# Patient Record
Sex: Male | Born: 1980 | Race: Black or African American | Hispanic: No | Marital: Married | Smoking: Current every day smoker
Health system: Southern US, Community
[De-identification: ages and names within clinical notes are randomized; demographics above are authoritative.]

## PROBLEM LIST (undated history)

## (undated) DIAGNOSIS — J45909 Unspecified asthma, uncomplicated: Secondary | ICD-10-CM

## (undated) HISTORY — PX: ABDOMINAL SURGERY: SHX537

---
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
## 2001-07-12 ENCOUNTER — Emergency Department (HOSPITAL_COMMUNITY): Admission: EM | Admit: 2001-07-12 | Discharge: 2001-07-12 | Payer: Self-pay | Admitting: Emergency Medicine

## 2001-12-28 ENCOUNTER — Encounter: Payer: Self-pay | Admitting: Emergency Medicine

## 2001-12-28 ENCOUNTER — Emergency Department (HOSPITAL_COMMUNITY): Admission: EM | Admit: 2001-12-28 | Discharge: 2001-12-29 | Payer: Self-pay | Admitting: Emergency Medicine

## 2003-07-07 ENCOUNTER — Emergency Department (HOSPITAL_COMMUNITY): Admission: EM | Admit: 2003-07-07 | Discharge: 2003-07-07 | Payer: Self-pay | Admitting: Family Medicine

## 2003-07-10 ENCOUNTER — Emergency Department (HOSPITAL_COMMUNITY): Admission: EM | Admit: 2003-07-10 | Discharge: 2003-07-10 | Payer: Self-pay | Admitting: Family Medicine

## 2003-12-14 ENCOUNTER — Emergency Department (HOSPITAL_COMMUNITY): Admission: EM | Admit: 2003-12-14 | Discharge: 2003-12-14 | Payer: Self-pay | Admitting: Emergency Medicine

## 2005-07-05 ENCOUNTER — Emergency Department (HOSPITAL_COMMUNITY): Admission: EM | Admit: 2005-07-05 | Discharge: 2005-07-05 | Payer: Self-pay | Admitting: Emergency Medicine

## 2007-04-17 ENCOUNTER — Inpatient Hospital Stay (HOSPITAL_COMMUNITY): Admission: EM | Admit: 2007-04-17 | Discharge: 2007-04-20 | Payer: Self-pay | Admitting: Emergency Medicine

## 2007-10-15 ENCOUNTER — Emergency Department (HOSPITAL_COMMUNITY): Admission: EM | Admit: 2007-10-15 | Discharge: 2007-10-15 | Payer: Self-pay | Admitting: Emergency Medicine

## 2008-02-06 ENCOUNTER — Encounter: Admission: RE | Admit: 2008-02-06 | Discharge: 2008-02-06 | Payer: Self-pay | Admitting: Family Medicine
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
## 2010-11-24 LAB — URINALYSIS, MICROSCOPIC ONLY
Glucose, UA: NEGATIVE
Ketones, ur: 40 — AB
Leukocytes, UA: NEGATIVE
Nitrite: NEGATIVE
Protein, ur: 100 — AB
Specific Gravity, Urine: 1.029
Urobilinogen, UA: 1
pH: 6

## 2010-11-24 LAB — I-STAT 8, (EC8 V) (CONVERTED LAB)
Acid-base deficit: 5 — ABNORMAL HIGH
Bicarbonate: 20.1
Chloride: 102
HCT: 49
Operator id: 222501
TCO2: 21
pCO2, Ven: 35.6 — ABNORMAL LOW

## 2010-11-24 LAB — CBC
HCT: 38.7 — ABNORMAL LOW
HCT: 44.3
Hemoglobin: 13.3
Hemoglobin: 15.4
MCHC: 34.5
MCHC: 34.8
MCHC: 34.4
MCV: 93.5
MCV: 93.6
Platelets: 283
Platelets: 208
RBC: 4.74
RDW: 13.1
RDW: 13.1
RDW: 12.7
WBC: 12.9 — ABNORMAL HIGH

## 2010-11-24 LAB — TYPE AND SCREEN: Antibody Screen: NEGATIVE

## 2010-11-24 LAB — POCT I-STAT CREATININE: Operator id: 222501

## 2010-11-24 LAB — ABO/RH: ABO/RH(D): A POS

## 2010-11-24 LAB — BASIC METABOLIC PANEL
BUN: 2 — ABNORMAL LOW
Calcium: 8.6
Creatinine, Ser: 0.92
GFR calc non Af Amer: >60
Glucose, Bld: 106 — ABNORMAL HIGH

## 2010-11-24 LAB — PROTIME-INR
INR: 1.1
Prothrombin Time: 14.5

## 2010-12-01 LAB — GC/CHLAMYDIA PROBE AMP, GENITAL: Chlamydia, DNA Probe: NEGATIVE

## 2014-08-31 ENCOUNTER — Emergency Department (HOSPITAL_COMMUNITY): Payer: Self-pay

## 2014-08-31 ENCOUNTER — Emergency Department (HOSPITAL_COMMUNITY)
Admission: EM | Admit: 2014-08-31 | Discharge: 2014-08-31 | Disposition: A | Discharge status: Home / Self Care | Payer: Self-pay | Attending: Emergency Medicine | Admitting: Emergency Medicine

## 2014-08-31 DIAGNOSIS — Z880 Allergy status to penicillin: Secondary | ICD-10-CM | POA: Insufficient documentation

## 2014-08-31 DIAGNOSIS — K529 Noninfective gastroenteritis and colitis, unspecified: Secondary | ICD-10-CM | POA: Insufficient documentation

## 2014-08-31 DIAGNOSIS — E860 Dehydration: Secondary | ICD-10-CM | POA: Insufficient documentation

## 2014-08-31 LAB — CBC WITH DIFFERENTIAL/PLATELET
BASOS ABS: 0 10*3/uL (ref 0.0–0.1)
BASOS PCT: 0 % (ref 0–1)
EOS ABS: 0.4 10*3/uL (ref 0.0–0.7)
EOS PCT: 5 % (ref 0–5)
HCT: 42.2 % (ref 39.0–52.0)
Hemoglobin: 15.3 g/dL (ref 13.0–17.0)
LYMPHS ABS: 2 10*3/uL (ref 0.7–4.0)
Lymphocytes Relative: 23 % (ref 12–46)
MCH: 32.6 pg (ref 26.0–34.0)
MCHC: 36.3 g/dL — AB (ref 30.0–36.0)
MCV: 89.8 fL (ref 78.0–100.0)
MONOS PCT: 8 % (ref 3–12)
Monocytes Absolute: 0.7 10*3/uL (ref 0.1–1.0)
NEUTROS PCT: 64 % (ref 43–77)
Neutro Abs: 5.5 10*3/uL (ref 1.7–7.7)
PLATELETS: 284 10*3/uL (ref 150–400)
RBC: 4.7 MIL/uL (ref 4.22–5.81)
RDW: 12.3 % (ref 11.5–15.5)
WBC: 8.7 10*3/uL (ref 4.0–10.5)

## 2014-08-31 LAB — I-STAT CHEM 8, ED
BUN: 9 mg/dL (ref 6–20)
CREATININE: 1.1 mg/dL (ref 0.61–1.24)
Calcium, Ion: 1.19 mmol/L (ref 1.12–1.23)
Chloride: 100 mmol/L — ABNORMAL LOW (ref 101–111)
GLUCOSE: 80 mg/dL (ref 65–99)
HCT: 48 % (ref 39.0–52.0)
HEMOGLOBIN: 16.3 g/dL (ref 13.0–17.0)
Potassium: 3.9 mmol/L (ref 3.5–5.1)
SODIUM: 137 mmol/L (ref 135–145)
TCO2: 25 mmol/L (ref 0–100)

## 2014-08-31 LAB — URINALYSIS, ROUTINE W REFLEX MICROSCOPIC
GLUCOSE, UA: NEGATIVE mg/dL
HGB URINE DIPSTICK: NEGATIVE
KETONES UR: NEGATIVE mg/dL
Leukocytes, UA: NEGATIVE
Nitrite: NEGATIVE
PH: 6 (ref 5.0–8.0)
PROTEIN: NEGATIVE mg/dL
Specific Gravity, Urine: 1.031 — ABNORMAL HIGH (ref 1.005–1.030)
Urobilinogen, UA: 1 mg/dL (ref 0.0–1.0)

## 2014-08-31 MED ORDER — ALBUTEROL SULFATE HFA 108 (90 BASE) MCG/ACT IN AERS
2.0000 | INHALATION_SPRAY | RESPIRATORY_TRACT | Status: DC | PRN
  Administered 2014-08-31: 2 via RESPIRATORY_TRACT
  Filled 2014-08-31: qty 6.7

## 2014-08-31 MED ORDER — IBUPROFEN 800 MG PO TABS: 800.0000 mg | ORAL_TABLET | Freq: Three times a day (TID) | ORAL | Status: DC | PRN

## 2014-08-31 MED ORDER — KETOROLAC TROMETHAMINE 30 MG/ML IJ SOLN
30.0000 mg | Freq: Once | INTRAMUSCULAR | Status: AC
  Administered 2014-08-31: 30 mg via INTRAVENOUS
  Filled 2014-08-31: qty 1

## 2014-08-31 MED ORDER — SODIUM CHLORIDE 0.9 % IV BOLUS (SEPSIS)
1000.0000 mL | Freq: Once | INTRAVENOUS | Status: AC
  Administered 2014-08-31: 1000 mL via INTRAVENOUS

## 2014-08-31 MED ORDER — AEROCHAMBER PLUS FLO-VU MEDIUM MISC
1.0000 | Freq: Once | Status: AC
  Administered 2014-08-31: 1
  Filled 2014-08-31: qty 1

## 2014-08-31 NOTE — ED Notes (Addendum)
Pt st's he has had headache x's 1 week.  St's he has had body aches and elevated temp,  Pt st's he feels better but continues to have headache and back pain

## 2014-08-31 NOTE — ED Provider Notes (Signed)
CSN: [id number]     Arrival date & time 08/31/14  1452 History   First MD Initiated Contact with Patient 08/31/14 1721     Chief Complaint  Patient presents with  . Headache     (Consider location/radiation/quality/duration/timing/severity/associated sxs/prior Treatment) HPI Patient presents to the emergency department with body aches, nausea, headache, vomiting, and chills over the last few days.  The patient states that on Friday.  His symptoms subsided, but then had another episode of vomiting yesterday.  The patient states that he had diarrhea associated with this as well.  Patient states that he did not have any chest pain, shortness of breath, abdominal pain, back pain, dysuria, incontinence, cough, lightheadedness, near syncope or syncope.  The patient states that did take some over-the-counter medications, which seemed to help with his headache somewhat No past medical history on file. No past surgical history on file. No family history on file. History  Substance Use Topics  . Smoking status: Not on file  . Smokeless tobacco: Not on file  . Alcohol Use: Not on file    Review of Systems  All other systems negative except as documented in the HPI. All pertinent positives and negatives as reviewed in the HPI.  Allergies  Ampicillin and Penicillins  Home Medications   Prior to Admission medications   Not on File   BP 118/74 mmHg  Pulse 44  Temp(Src) 98.5 F (36.9 C) (Oral)  Resp 20  SpO2 100% Physical Exam  Constitutional: He is oriented to person, place, and time. He appears well-developed and well-nourished. No distress.  HENT:  Head: Normocephalic and atraumatic.  Mouth/Throat: Oropharynx is clear and moist.  Eyes: Pupils are equal, round, and reactive to light.  Neck: Normal range of motion. Neck supple.  Cardiovascular: Normal rate, regular rhythm and normal heart sounds.  Exam reveals no gallop and no friction rub.   No murmur heard. Pulmonary/Chest: Effort  normal and breath sounds normal. No respiratory distress.  Abdominal: Soft. Bowel sounds are normal. He exhibits no distension. There is no tenderness.  Neurological: He is alert and oriented to person, place, and time. He exhibits normal muscle tone. Coordination normal.  Skin: Skin is warm and dry. No rash noted. No erythema.  Nursing note and vitals reviewed.   ED Course  Procedures (including critical care time) Labs Review Labs Reviewed  CBC WITH DIFFERENTIAL/PLATELET - Abnormal; Notable for the following:    MCHC 36.3 (*)    All other components within normal limits  URINALYSIS, ROUTINE W REFLEX MICROSCOPIC (NOT AT [facility]) - Abnormal; Notable for the following:    Specific Gravity, Urine 1.031 (*)    Bilirubin Urine SMALL (*)    All other components within normal limits  I-STAT CHEM 8, ED - Abnormal; Notable for the following:    Chloride 100 (*)    All other components within normal limits    Imaging Review Dg Chest 2 View  08/31/2014   CLINICAL DATA:  34 year old male with fever and headache.  EXAM: CHEST  2 VIEW  COMPARISON:  None.  FINDINGS: The heart size and mediastinal contours are within normal limits. Both lungs are clear. The visualized skeletal structures are unremarkable.  IMPRESSION: No active cardiopulmonary disease.   Electronically Signed   By: Elgie CollardArash  Radparvar M.D.   On: 08/31/2014 20:31      The patient's headache is most likely associated with the gastroenteritis and dehydration that the patient has had patient is advised return here as needed.  He does not have any neurological deficits noted on exam.  Intracranial abnormalities such as bleeding seem less likely.  His headache has responded to over-the-counter ibuprofen  Charlestine Night, PA-C 08/31/14 2100  Gilda Crease, MD 08/31/14 2104

## 2014-08-31 NOTE — Discharge Instructions (Signed)
Follow-up with the clinic provided.  Return here as needed.  Increase your fluid intake and rest as much as possible

## 2015-02-13 ENCOUNTER — Emergency Department (HOSPITAL_COMMUNITY)
Admission: EM | Admit: 2015-02-13 | Discharge: 2015-02-13 | Disposition: A | Discharge status: Home / Self Care | Payer: Self-pay | Attending: Emergency Medicine | Admitting: Emergency Medicine

## 2015-02-13 ENCOUNTER — Encounter (HOSPITAL_COMMUNITY): Payer: Self-pay | Admitting: Emergency Medicine

## 2015-02-13 DIAGNOSIS — F17210 Nicotine dependence, cigarettes, uncomplicated: Secondary | ICD-10-CM | POA: Insufficient documentation

## 2015-02-13 DIAGNOSIS — M25512 Pain in left shoulder: Secondary | ICD-10-CM | POA: Insufficient documentation

## 2015-02-13 DIAGNOSIS — Z880 Allergy status to penicillin: Secondary | ICD-10-CM | POA: Insufficient documentation

## 2015-02-13 DIAGNOSIS — M545 Low back pain, unspecified: Secondary | ICD-10-CM

## 2015-02-13 DIAGNOSIS — J45909 Unspecified asthma, uncomplicated: Secondary | ICD-10-CM | POA: Insufficient documentation

## 2015-02-13 HISTORY — DX: Unspecified asthma, uncomplicated: J45.909

## 2015-02-13 MED ORDER — METHOCARBAMOL 500 MG PO TABS: 500.0000 mg | ORAL_TABLET | Freq: Two times a day (BID) | ORAL | Status: DC | PRN

## 2015-02-13 MED ORDER — NAPROXEN 250 MG PO TABS: 250.0000 mg | ORAL_TABLET | Freq: Two times a day (BID) | ORAL | Status: DC

## 2015-02-13 NOTE — ED Notes (Signed)
Pt from home for eval of bilateral lower back and also left shoulder pain that started Friday, pt states he does a lot of heavy lifting at work and states has taken ibuprofen today for pain with minimal relief. Denies any numbness.

## 2015-02-13 NOTE — ED Provider Notes (Signed)
CSN: [id number]     Arrival date & time 02/13/15  1830 History  By signing my name below, I, Evon Slack, attest that this documentation has been prepared under the direction and in the presence of Everlene Farrier, PA-C. Electronically Signed: Evon Slack, ED Scribe. 02/13/2015. 6:54 PM.      Chief Complaint  Patient presents with  . Back Pain  . Shoulder Pain   The history is provided by the patient. No language interpreter was used.   HPI Comments: Ronald Walker is a 34 y.o. male who presents to the Emergency Department complaining of low back pain onset 7 days prior. Pt rates the pain 7/10. He states that the pain is worse with movement and standing up. Pt reports that he does a lot of heavy lifting while at work. Pt reports taking ibuprofen with some relief. Pt also complaining of left shoulder pain intermittently, but none currently. Denies fevers, falls, trauma, numbness, weakness, dysuria, heamturia, ugency, frequency, difficulty urinating, bowel/bladder incontince, abdominal pain, nausea or vomitng.  Denies personal Hx of cancer or Iv drug use.  Past Medical History  Diagnosis Date  . Asthma    History reviewed. No pertinent past surgical history. No family history on file. Social History  Substance Use Topics  . Smoking status: Current Every Day Smoker -- 1.00 packs/day    Types: Cigarettes  . Smokeless tobacco: None  . Alcohol Use: Yes    Review of Systems  Constitutional: Negative for fever.  Eyes: Negative for visual disturbance.  Gastrointestinal: Negative for nausea, vomiting, abdominal pain and diarrhea.  Genitourinary: Negative for dysuria, urgency, frequency, hematuria, decreased urine volume and difficulty urinating.  Musculoskeletal: Positive for back pain and arthralgias. Negative for neck pain.  Skin: Negative for rash and wound.  Neurological: Negative for dizziness, weakness, light-headedness and numbness.     Allergies  Ampicillin and  Penicillins  Home Medications   Prior to Admission medications   Medication Sig Start Date End Date Taking? Authorizing Provider  methocarbamol (ROBAXIN) 500 MG tablet Take 1 tablet (500 mg total) by mouth 2 (two) times daily as needed for muscle spasms. 02/13/15   Everlene Farrier, PA-C  naproxen (NAPROSYN) 250 MG tablet Take 1 tablet (250 mg total) by mouth 2 (two) times daily with a meal. 02/13/15   Everlene Farrier, PA-C   BP 131/60 mmHg  Pulse 74  Temp(Src) 98.1 F (36.7 C) (Oral)  Resp 18  Ht  (1.753 m)  Wt 81.647 kg  BMI 26.57 kg/m2  SpO2 100%   Physical Exam  Constitutional: He appears well-developed and well-nourished. No distress.  Nontoxic appearing.  HENT:  Head: Normocephalic and atraumatic.  Eyes: Conjunctivae are normal. Pupils are equal, round, and reactive to light. Right eye exhibits no discharge. Left eye exhibits no discharge.  Neck: Normal range of motion. Neck supple. No JVD present.  Cardiovascular: Normal rate, regular rhythm, normal heart sounds and intact distal pulses.   Pulmonary/Chest: Effort normal and breath sounds normal. No respiratory distress. He has no wheezes. He has no rales.  Abdominal: Soft. He exhibits no distension. There is no tenderness. There is no guarding.  Musculoskeletal: Normal range of motion. He exhibits tenderness. He exhibits no edema.  Bilateral paraspinal low back tenderness. No midline neck or back tenderness. Good strength in his bilateral upper and lower extremities. He is able to ambulate with normal gait. No back edema, erythema, deformity or ecchymosis. Normal shoulder range of motion bilaterally. No bony point tenderness.  Lymphadenopathy:  He has no cervical adenopathy.  Neurological: He is alert. He has normal reflexes. He displays normal reflexes. Coordination normal.  Sensation is intact his bilateral lower extremities. Bilateral patellar DTRs are intact. He is able to ambulate with normal gait.  Skin: Skin is  warm and dry. No rash noted. He is not diaphoretic. No erythema. No pallor.  Psychiatric: He has a normal mood and affect. His behavior is normal.  Nursing note and vitals reviewed.   ED Course  Procedures (including critical care time) DIAGNOSTIC STUDIES: Oxygen Saturation is 100% on RA, normal by my interpretation.    COORDINATION OF CARE: 6:44 PM-Discussed treatment plan with pt at bedside and pt agreed to plan.     Labs Review Labs Reviewed - No data to display  Imaging Review No results found.    EKG Interpretation None      Filed Vitals:   02/13/15 1839  BP: 131/60  Pulse: 74  Temp: 98.1 F (36.7 C)  TempSrc: Oral  Resp: 18  Height: 5\' 9"  (1.753 m)  Weight: 81.647 kg  SpO2: 100%     MDM   Meds given in ED:  Medications - No data to display  Discharge Medication List as of 02/13/2015  6:49 PM    START taking these medications   Details  methocarbamol (ROBAXIN) 500 MG tablet Take 1 tablet (500 mg total) by mouth 2 (two) times daily as needed for muscle spasms., Starting 02/13/2015, Until Discontinued, Print    naproxen (NAPROSYN) 250 MG tablet Take 1 tablet (250 mg total) by mouth 2 (two) times daily with a meal., Starting 02/13/2015, Until Discontinued, Print        Final diagnoses:  Bilateral low back pain without sciatica    Patient with bilateral paraspinous low back pain for one week which is worse with movement. Patient does lots of heavy lifting at work. No falls or trauma. No midline tenderness. No neurological deficits and normal neuro exam.  Patient can walk with normal gait.   No loss of bowel or bladder control.  No concern for cauda equina.  No fever, night sweats, weight loss, h/o cancer, IVDU.  RICE protocol and pain medicine indicated and discussed with patient. I discussed safe lifting practices. I advised the patient to follow-up with their primary care provider this week. I advised the patient to return to the emergency department with  new or worsening symptoms or new concerns. The patient verbalized understanding and agreement with plan.    I personally performed the services described in this documentation, which was scribed in my presence. The recorded information has been reviewed and is accurate.      Everlene FarrierWilliam Karmah Potocki, PA-C 02/13/15 1857  Gwyneth SproutWhitney Plunkett, MD 02/14/15 0005

## 2015-02-13 NOTE — Discharge Instructions (Signed)
Back Exercises The following exercises strengthen the muscles that help to support the back. They also help to keep the lower back flexible. Doing these exercises can help to prevent back pain or lessen existing pain. If you have back pain or discomfort, try doing these exercises 2-3 times each day or as told by your health care provider. When the pain goes away, do them once each day, but increase the number of times that you repeat the steps for each exercise (do more repetitions). If you do not have back pain or discomfort, do these exercises once each day or as told by your health care provider. EXERCISES Single Knee to Chest Repeat these steps 3-5 times for each leg:  Lie on your back on a firm bed or the floor with your legs extended.  Bring one knee to your chest. Your other leg should stay extended and in contact with the floor.  Hold your knee in place by grabbing your knee or thigh.  Pull on your knee until you feel a gentle stretch in your lower back.  Hold the stretch for 10-30 seconds.  Slowly release and straighten your leg. Pelvic Tilt Repeat these steps 5-10 times:  Lie on your back on a firm bed or the floor with your legs extended.  Bend your knees so they are pointing toward the ceiling and your feet are flat on the floor.  Tighten your lower abdominal muscles to press your lower back against the floor. This motion will tilt your pelvis so your tailbone points up toward the ceiling instead of pointing to your feet or the floor.  With gentle tension and even breathing, hold this position for 5-10 seconds. Cat-Cow Repeat these steps until your lower back becomes more flexible:  Get into a hands-and-knees position on a firm surface. Keep your hands under your shoulders, and keep your knees under your hips. You may place padding under your knees for comfort.  Let your head hang down, and point your tailbone toward the floor so your lower back becomes rounded like the  back of a cat.  Hold this position for 5 seconds.  Slowly lift your head and point your tailbone up toward the ceiling so your back forms a sagging arch like the back of a cow.  Hold this position for 5 seconds. Press-Ups Repeat these steps 5-10 times:  Lie on your abdomen (face-down) on the floor.  Place your palms near your head, about shoulder-width apart.  While you keep your back as relaxed as possible and keep your hips on the floor, slowly straighten your arms to raise the top half of your body and lift your shoulders. Do not use your back muscles to raise your upper torso. You may adjust the placement of your hands to make yourself more comfortable.  Hold this position for 5 seconds while you keep your back relaxed.  Slowly return to lying flat on the floor. Bridges Repeat these steps 10 times:  Lie on your back on a firm surface.  Bend your knees so they are pointing toward the ceiling and your feet are flat on the floor.  Tighten your buttocks muscles and lift your buttocks off of the floor until your waist is at almost the same height as your knees. You should feel the muscles working in your buttocks and the back of your thighs. If you do not feel these muscles, slide your feet 1-2 inches farther away from your buttocks.  Hold this position for 3-5  seconds.  Slowly lower your hips to the starting position, and allow your buttocks muscles to relax completely. If this exercise is too easy, try doing it with your arms crossed over your chest. Abdominal Crunches Repeat these steps 5-10 times:  Lie on your back on a firm bed or the floor with your legs extended.  Bend your knees so they are pointing toward the ceiling and your feet are flat on the floor.  Cross your arms over your chest.  Tip your chin slightly toward your chest without bending your neck.  Tighten your abdominal muscles and slowly raise your trunk (torso) high enough to lift your shoulder blades a  tiny bit off of the floor. Avoid raising your torso higher than that, because it can put too much stress on your low back and it does not help to strengthen your abdominal muscles.  Slowly return to your starting position. Back Lifts Repeat these steps 5-10 times: 1. Lie on your abdomen (face-down) with your arms at your sides, and rest your forehead on the floor. 2. Tighten the muscles in your legs and your buttocks. 3. Slowly lift your chest off of the floor while you keep your hips pressed to the floor. Keep the back of your head in line with the curve in your back. Your eyes should be looking at the floor. 4. Hold this position for 3-5 seconds. 5. Slowly return to your starting position. SEEK MEDICAL CARE IF:  Your back pain or discomfort gets much worse when you do an exercise.  Your back pain or discomfort does not lessen within 2 hours after you exercise. If you have any of these problems, stop doing these exercises right away. Do not do them again unless your health care provider says that you can. SEEK IMMEDIATE MEDICAL CARE IF:  You develop sudden, severe back pain. If this happens, stop doing the exercises right away. Do not do them again unless your health care provider says that you can.   This information is not intended to replace advice given to you by your health care provider. Make sure you discuss any questions you have with your health care provider.   Document Released: 03/29/2004 Document Revised: 11/10/2014 Document Reviewed: 04/15/2014 Elsevier Interactive Patient Education 2016 [employer]  Back Pain, Adult Back pain is very common in adults.The cause of back pain is rarely dangerous and the pain often gets better over time.The cause of your back pain may not be known. Some common causes of back pain include:  Strain of the muscles or ligaments supporting the spine.  Wear and tear (degeneration) of the spinal disks.  Arthritis.  Direct injury to the  back. For many people, back pain may return. Since back pain is rarely dangerous, most people can learn to manage this condition on their own. HOME CARE INSTRUCTIONS Watch your back pain for any changes. The following actions may help to lessen any discomfort you are feeling:  Remain active. It is stressful on your back to sit or stand in one place for long periods of time. Do not sit, drive, or stand in one place for more than 30 minutes at a time. Take short walks on even surfaces as soon as you are able.Try to increase the length of time you walk each day.  Exercise regularly as directed by your health care provider. Exercise helps your back heal faster. It also helps avoid future injury by keeping your muscles strong and flexible.  Do not stay in  bed.Resting more than 1-2 days can delay your recovery.  Pay attention to your body when you bend and lift. The most comfortable positions are those that put less stress on your recovering back. Always use proper lifting techniques, including:  Bending your knees.  Keeping the load close to your body.  Avoiding twisting.  Find a comfortable position to sleep. Use a firm mattress and lie on your side with your knees slightly bent. If you lie on your back, put a pillow under your knees.  Avoid feeling anxious or stressed.Stress increases muscle tension and can worsen back pain.It is important to recognize when you are anxious or stressed and learn ways to manage it, such as with exercise.  Take medicines only as directed by your health care provider. Over-the-counter medicines to reduce pain and inflammation are often the most helpful.Your health care provider may prescribe muscle relaxant drugs.These medicines help dull your pain so you can more quickly return to your normal activities and healthy exercise.  Apply ice to the injured area:  Put ice in a plastic bag.  Place a towel between your skin and the bag.  Leave the ice on for 20  minutes, 2-3 times a day for the first 2-3 days. After that, ice and heat may be alternated to reduce pain and spasms.  Maintain a healthy weight. Excess weight puts extra stress on your back and makes it difficult to maintain good posture. SEEK MEDICAL CARE IF:  You have pain that is not relieved with rest or medicine.  You have increasing pain going down into the legs or buttocks.  You have pain that does not improve in one week.  You have night pain.  You lose weight.  You have a fever or chills. SEEK IMMEDIATE MEDICAL CARE IF:   You develop new bowel or bladder control problems.  You have unusual weakness or numbness in your arms or legs.  You develop nausea or vomiting.  You develop abdominal pain.  You feel faint.   This information is not intended to replace advice given to you by your health care provider. Make sure you discuss any questions you have with your health care provider.   Document Released: 02/19/2005 Document Revised: 03/12/2014 Document Reviewed: 06/23/2013 Elsevier Interactive Patient Education 2016 [place] Injury Prevention Back injuries can be very painful. They can also be difficult to heal. After having one back injury, you are more likely to injure your back again. It is important to learn how to avoid injuring or re-injuring your back. The following tips can help you to prevent a back injury. WHAT SHOULD I KNOW ABOUT PHYSICAL FITNESS?  Exercise for 30 minutes per day on most days of the week or as directed by your health care provider. Make sure to:  Do aerobic exercises, such as walking, jogging, biking, or swimming.  Do exercises that increase balance and strength, such as tai chi and yoga. These can decrease your risk of falling and injuring your back.  Do stretching exercises to help with flexibility.  Try to develop strong abdominal muscles. Your abdominal muscles provide a lot of the support that is needed by your  back.  Maintain a healthy weight. This helps to decrease your risk of a back injury. WHAT SHOULD I KNOW ABOUT MY DIET?  Talk with your health care provider about your overall diet. Take supplements and vitamins only as directed by your health care provider.  Talk with your health care provider about how  much calcium and vitamin D you need each day. These nutrients help to prevent weakening of the bones (osteoporosis). Osteoporosis can cause broken (fractured) bones, which lead to back pain. °· Include good sources of calcium in your diet, such as dairy products, green leafy vegetables, and products that have had calcium added to them (fortified). °· Include good sources of vitamin D in your diet, such as milk and foods that are fortified with vitamin D. °WHAT SHOULD I KNOW ABOUT MY POSTURE? °· Sit up straight and stand up straight. Avoid leaning forward when you sit or hunching over when you stand. °· Choose chairs that have good low-back (lumbar) support. °· If you work at a desk, sit close to it so you do not need to lean over. Keep your chin tucked in. Keep your neck drawn back, and keep your elbows bent at a right angle. Your arms should look like the letter "L." °· Sit high and close to the steering wheel when you drive. Add a lumbar support to your car seat, if needed. °· Avoid sitting or standing in one position for very long. Take breaks to get up, stretch, and walk around at least one time every hour. Take breaks every hour if you are driving for long periods of time. °· Sleep on your side with your knees slightly bent, or sleep on your back with a pillow under your knees. Do not lie on the front of your body to sleep. °WHAT SHOULD I KNOW ABOUT LIFTING, TWISTING, AND REACHING? °Lifting and Heavy Lifting °· Avoid heavy lifting, especially repetitive heavy lifting. If you must do heavy lifting: °¨ Stretch before lifting. °¨ Work slowly. °¨ Rest between lifts. °¨ Use a tool such as a cart or a dolly to  move objects if one is available. °¨ Make several small trips instead of carrying one heavy load. °¨ Ask for help when you need it, especially when moving big objects. °· Follow these steps when lifting: °¨ Stand with your feet shoulder-width apart. °¨ Get as close to the object as you can. Do not try to pick up a heavy object that is far from your body. °¨ Use handles or lifting straps if they are available. °¨ Bend at your knees. Squat down, but keep your heels off the floor. °¨ Keep your shoulders pulled back, your chin tucked in, and your back straight. °¨ Lift the object slowly while you tighten the muscles in your legs, abdomen, and buttocks. Keep the object as close to the center of your body as possible. °· Follow these steps when putting down a heavy load: °¨ Stand with your feet shoulder-width apart. °¨ Lower the object slowly while you tighten the muscles in your legs, abdomen, and buttocks. Keep the object as close to the center of your body as possible. °¨ Keep your shoulders pulled back, your chin tucked in, and your back straight. °¨ Bend at your knees. Squat down, but keep your heels off the floor. °¨ Use handles or lifting straps if they are available. °Twisting and Reaching °· Avoid lifting heavy objects above your waist. °· Do not twist at your waist while you are lifting or carrying a load. If you need to turn, move your feet. °· Do not bend over without bending at your knees. °· Avoid reaching over your head, across a table, or for an object on a high surface. °WHAT ARE SOME OTHER TIPS? °· Avoid wet floors and icy ground. Keep sidewalks clear of ice   to prevent falls. °· Do not sleep on a mattress that is too soft or too hard. °· Keep items that are used frequently within easy reach. °· Put heavier objects on shelves at waist level, and put lighter objects on lower or higher shelves. °· Find ways to decrease your stress, such as exercise, massage, or relaxation techniques. Stress can build up in  your muscles. Tense muscles are more vulnerable to injury. °· Talk with your health care provider if you feel anxious or depressed. These conditions can make back pain worse. °· Wear flat heel shoes with cushioned soles. °· Avoid sudden movements. °· Use both shoulder straps when carrying a backpack. °· Do not use any tobacco products, including cigarettes, chewing tobacco, or electronic cigarettes. If you need help quitting, ask your health care provider. °  °This information is not intended to replace advice given to you by your health care provider. Make sure you discuss any questions you have with your health care provider. °  °Document Released: 03/29/2004 Document Revised: 07/06/2014 Document Reviewed: 02/23/2014 °Elsevier Interactive Patient Education ©2016 Elsevier Inc. ° °

## 2015-09-27 ENCOUNTER — Encounter (HOSPITAL_COMMUNITY): Payer: Self-pay | Admitting: Emergency Medicine

## 2015-09-27 ENCOUNTER — Emergency Department (HOSPITAL_COMMUNITY): Payer: Self-pay

## 2015-09-27 ENCOUNTER — Emergency Department (HOSPITAL_COMMUNITY)
Admission: EM | Admit: 2015-09-27 | Discharge: 2015-09-27 | Disposition: A | Discharge status: Home / Self Care | Payer: Self-pay | Attending: Emergency Medicine | Admitting: Emergency Medicine

## 2015-09-27 DIAGNOSIS — M79671 Pain in right foot: Secondary | ICD-10-CM | POA: Insufficient documentation

## 2015-09-27 DIAGNOSIS — Y92219 Unspecified school as the place of occurrence of the external cause: Secondary | ICD-10-CM | POA: Insufficient documentation

## 2015-09-27 DIAGNOSIS — F17210 Nicotine dependence, cigarettes, uncomplicated: Secondary | ICD-10-CM | POA: Insufficient documentation

## 2015-09-27 DIAGNOSIS — M25511 Pain in right shoulder: Secondary | ICD-10-CM | POA: Insufficient documentation

## 2015-09-27 DIAGNOSIS — Y9361 Activity, american tackle football: Secondary | ICD-10-CM | POA: Insufficient documentation

## 2015-09-27 DIAGNOSIS — Z79891 Long term (current) use of opiate analgesic: Secondary | ICD-10-CM | POA: Insufficient documentation

## 2015-09-27 DIAGNOSIS — J45909 Unspecified asthma, uncomplicated: Secondary | ICD-10-CM | POA: Insufficient documentation

## 2015-09-27 DIAGNOSIS — Z7951 Long term (current) use of inhaled steroids: Secondary | ICD-10-CM | POA: Insufficient documentation

## 2015-09-27 DIAGNOSIS — X58XXXA Exposure to other specified factors, initial encounter: Secondary | ICD-10-CM | POA: Insufficient documentation

## 2015-09-27 MED ORDER — NAPROXEN 500 MG PO TABS: 500.0000 mg | ORAL_TABLET | Freq: Two times a day (BID) | ORAL | 0 refills | Status: DC

## 2015-09-27 MED ORDER — HYDROCODONE-ACETAMINOPHEN 5-325 MG PO TABS
1.0000 | ORAL_TABLET | Freq: Once | ORAL | Status: AC
  Administered 2015-09-27: 1 via ORAL
  Filled 2015-09-27: qty 1

## 2015-09-27 MED ORDER — NAPROXEN 500 MG PO TABS
500.0000 mg | ORAL_TABLET | Freq: Once | ORAL | Status: AC
  Administered 2015-09-27: 500 mg via ORAL
  Filled 2015-09-27: qty 1

## 2015-09-27 MED ORDER — HYDROCODONE-ACETAMINOPHEN 5-325 MG PO TABS: 1.0000 | ORAL_TABLET | ORAL | 0 refills | Status: DC | PRN

## 2015-09-27 MED ORDER — METHOCARBAMOL 500 MG PO TABS: 500.0000 mg | ORAL_TABLET | Freq: Two times a day (BID) | ORAL | 0 refills | Status: DC

## 2015-09-27 NOTE — Discharge Instructions (Signed)
Your x-ray was negative. Please follow up with Dr. Lovey Newcomer, an orthopedic surgeon, for further evaluation.

## 2015-09-27 NOTE — ED Triage Notes (Signed)
Pt also wants to be checked for "a growth on his left foot".

## 2015-09-27 NOTE — ED Triage Notes (Signed)
Pt states that he had an old rotator cuff injury that he reinjured last Wednesday.  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  Has not been able to feel better since.

## 2015-09-27 NOTE — ED Provider Notes (Signed)
[facility]-EMERGENCY DEPT Provider Note   CSN: [id number] Arrival date & time: 09/27/15  1257  First Provider Contact:  First MD Initiated Contact with Patient 09/27/15 1400     History   Chief Complaint Chief Complaint  Patient presents with  . Shoulder Pain   HPI  Ronald Walker is an 35 y.o. male with no significant PMH who presents to the ED for evaluation of right shoulder pain. He states he tore his rotator cuff when he was in middle school playing football. He states that since then he has had frequent/easy shoulder dislocations and he is always able to pop his shoulder back into place. He states that a few days ago he was working (is a [employer]) and while he was sweeping he felt his shoulder pop out. He states he popped it back in but since then has had severe right shoulder pain. He feels like the pain is in his muscles. He states he cannot move his shoulder well at baseline but now it is even worse with the pain. He has tried ibuprofen and tylenol with no relief. Denies new numbness, weakness, or tingling.  He states he is also here for pain in the bottom of his right foot. He states he wears work boots at work and there is an area of swelling and pain at the ball of his right foot. He thinks it's from his boots. Denies radiation of the pain. Denies known injury or trauama.  Past Medical History:  Diagnosis Date  . Asthma     There are no active problems to display for this patient.   No past surgical history on file.     Home Medications    Prior to Admission medications   Medication Sig Start Date End Date Taking? Authorizing Provider  acetaminophen (TYLENOL) 500 MG tablet Take 1,000 mg by mouth every 6 (six) hours as needed for moderate pain.   Yes Historical Provider, MD  albuterol (PROVENTIL HFA;VENTOLIN HFA) 108 (90 Base) MCG/ACT inhaler Inhale 2 puffs into the lungs every 6 (six) hours as needed for wheezing or shortness of breath.   Yes Historical Provider, MD    HYDROcodone-acetaminophen (NORCO/VICODIN) 5-325 MG tablet Take 1 tablet by mouth every 4 (four) hours as needed. 09/27/15   Ace Gins Lerone Onder, PA-C  methocarbamol (ROBAXIN) 500 MG tablet Take 1 tablet (500 mg total) by mouth 2 (two) times daily. 09/27/15   Ace Gins Devone Bonilla, PA-C  naproxen (NAPROSYN) 500 MG tablet Take 1 tablet (500 mg total) by mouth 2 (two) times daily. 09/27/15   Carlene Coria, PA-C    Family History No family history on file.  Social History Social History  Substance Use Topics  . Smoking status: Current Every Day Smoker    Packs/day: 1.00    Types: Cigarettes  . Smokeless tobacco: Never Used  . Alcohol use Yes     Allergies   Ampicillin and Penicillins   Review of Systems Review of Systems 10 Systems reviewed and are negative for acute change except as noted in the HPI.   Physical Exam Updated Vital Signs BP 126/80 (BP Location: Right Arm)   Pulse (!) 55   Temp 97.7 F (36.5 C) (Oral)   Resp 18   Ht  (1.753 m)   Wt 75.8 kg   SpO2 100%   BMI 24.66 kg/m   Physical Exam  Constitutional: He is oriented to person, place, and time. No distress.  HENT:  Head: Atraumatic.  Right Ear: External  ear normal.  Left Ear: External ear normal.  Nose: Nose normal.  Eyes: Conjunctivae are normal. No scleral icterus.  Cardiovascular: Normal rate and regular rhythm.   Pulmonary/Chest: Effort normal. No respiratory distress.  Abdominal: He exhibits no distension.  Musculoskeletal:  Diffuse tenderness to right shoulder, particularly anterior deltoid. Limited ROM due to pain. FROM at elbow and wrist. 2+ radial pulses. 5/5 grip strength.   Right dorsum of foot with 2-cm area of thickened skin. This area is tender. Not erythematous.   Neurological: He is alert and oriented to person, place, and time.  Skin: Skin is warm and dry. He is not diaphoretic.  Psychiatric: He has a normal mood and affect. His behavior is normal.  Nursing note and vitals reviewed.    ED  Treatments / Results  Labs (all labs ordered are listed, but only abnormal results are displayed) Labs Reviewed - No data to display  EKG  EKG Interpretation None       Radiology Dg Shoulder Right  Result Date: 09/27/2015 CLINICAL DATA:  Right shoulder dislocation. Severe right shoulder pain. The patient reduced the dislocation himself. EXAM: RIGHT SHOULDER - 2+ VIEW COMPARISON:  Right shoulder radiographs 02/06/2008 FINDINGS: The right shoulder is located. No acute bone or soft tissue abnormality is present. The visualized hemi thorax is clear. The clavicle is intact. IMPRESSION: 1. No acute osseous abnormality. Electronically Signed   By: Marin Roberts M.D.   On: 09/27/2015 16:29   Procedures Procedures (including critical care time)  Medications Ordered in ED Medications  naproxen (NAPROSYN) tablet 500 mg (500 mg Oral Given 09/27/15 1542)  HYDROcodone-acetaminophen (NORCO/VICODIN) 5-325 MG per tablet 1 tablet (1 tablet Oral Given 09/27/15 1542)     Initial Impression / Assessment and Plan / ED Course  I have reviewed the triage vital signs and the nursing notes.  Pertinent labs & imaging results that were available during my care of the patient were reviewed by me and considered in my medical decision making (see chart for details).  X-ray negative. Pt will need to see orthopedics as I suspect he has some acute on chronic soft tissue injury. In the meantime rx given for supportive meds. His foot has an area of thickened skin. Encouraged to buy insoles for his work boots. Also f/u with ortho regarding this issue.  Final Clinical Impressions(s) / ED Diagnoses   Final diagnoses:  Right shoulder pain  Right foot pain    New Prescriptions Discharge Medication List as of 09/27/2015  5:06 PM    START taking these medications   Details  HYDROcodone-acetaminophen (NORCO/VICODIN) 5-325 MG tablet Take 1 tablet by mouth every 4 (four) hours as needed., Starting Tue  09/27/2015, Print    methocarbamol (ROBAXIN) 500 MG tablet Take 1 tablet (500 mg total) by mouth 2 (two) times daily., Starting Tue 09/27/2015, Print    naproxen (NAPROSYN) 500 MG tablet Take 1 tablet (500 mg total) by mouth 2 (two) times daily., Starting Tue 09/27/2015, Print         Carlene Coria, PA-C 09/27/15 2035    Mancel Bale, MD 10/04/15 [phone]

## 2016-02-04 IMAGING — CR DG CHEST 2V
2 series · 2 of 2 positions shown · non-contrast
Comparison: None.

CLINICAL DATA: 34-year-old male with fever and headache.

EXAM:
CHEST  2 VIEW

[w chest pa]
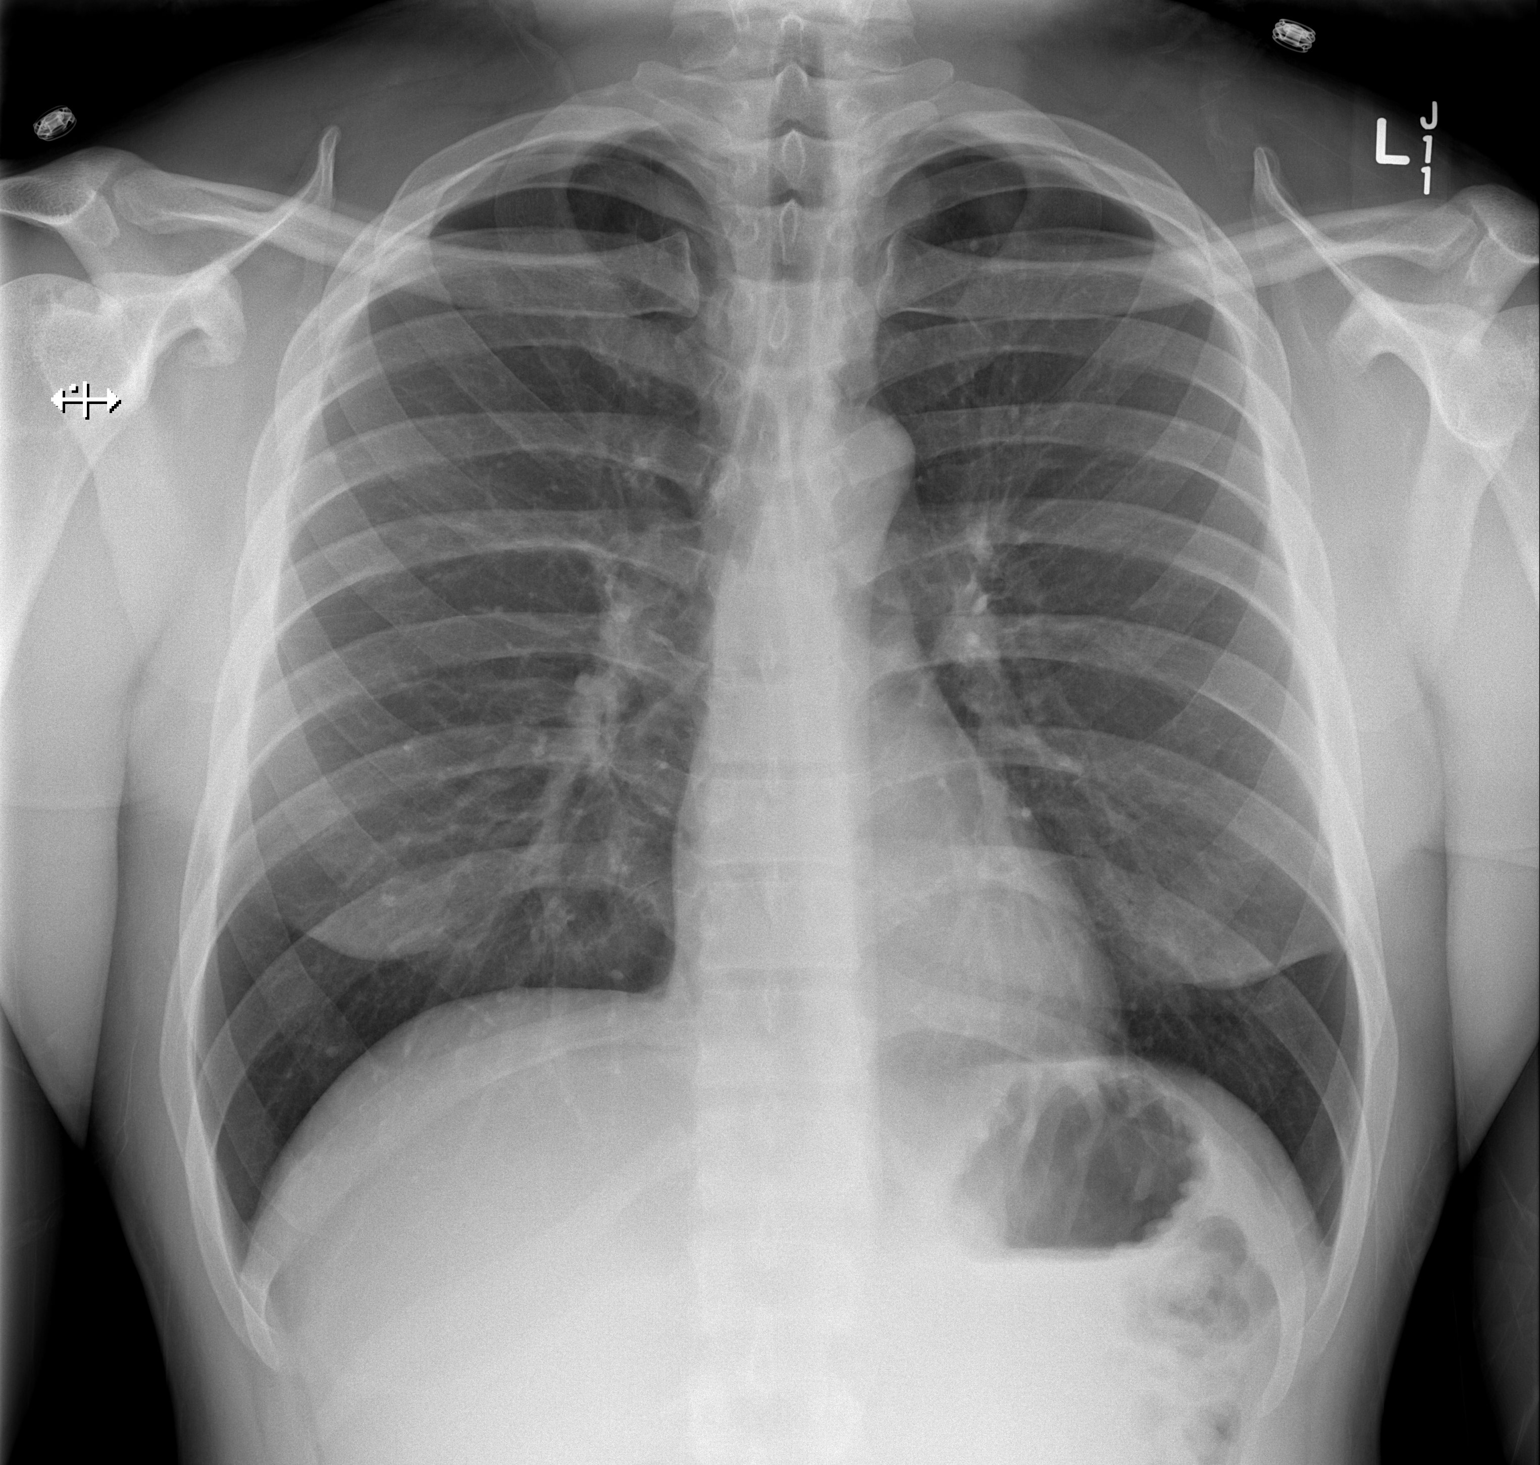

[w chest lat]
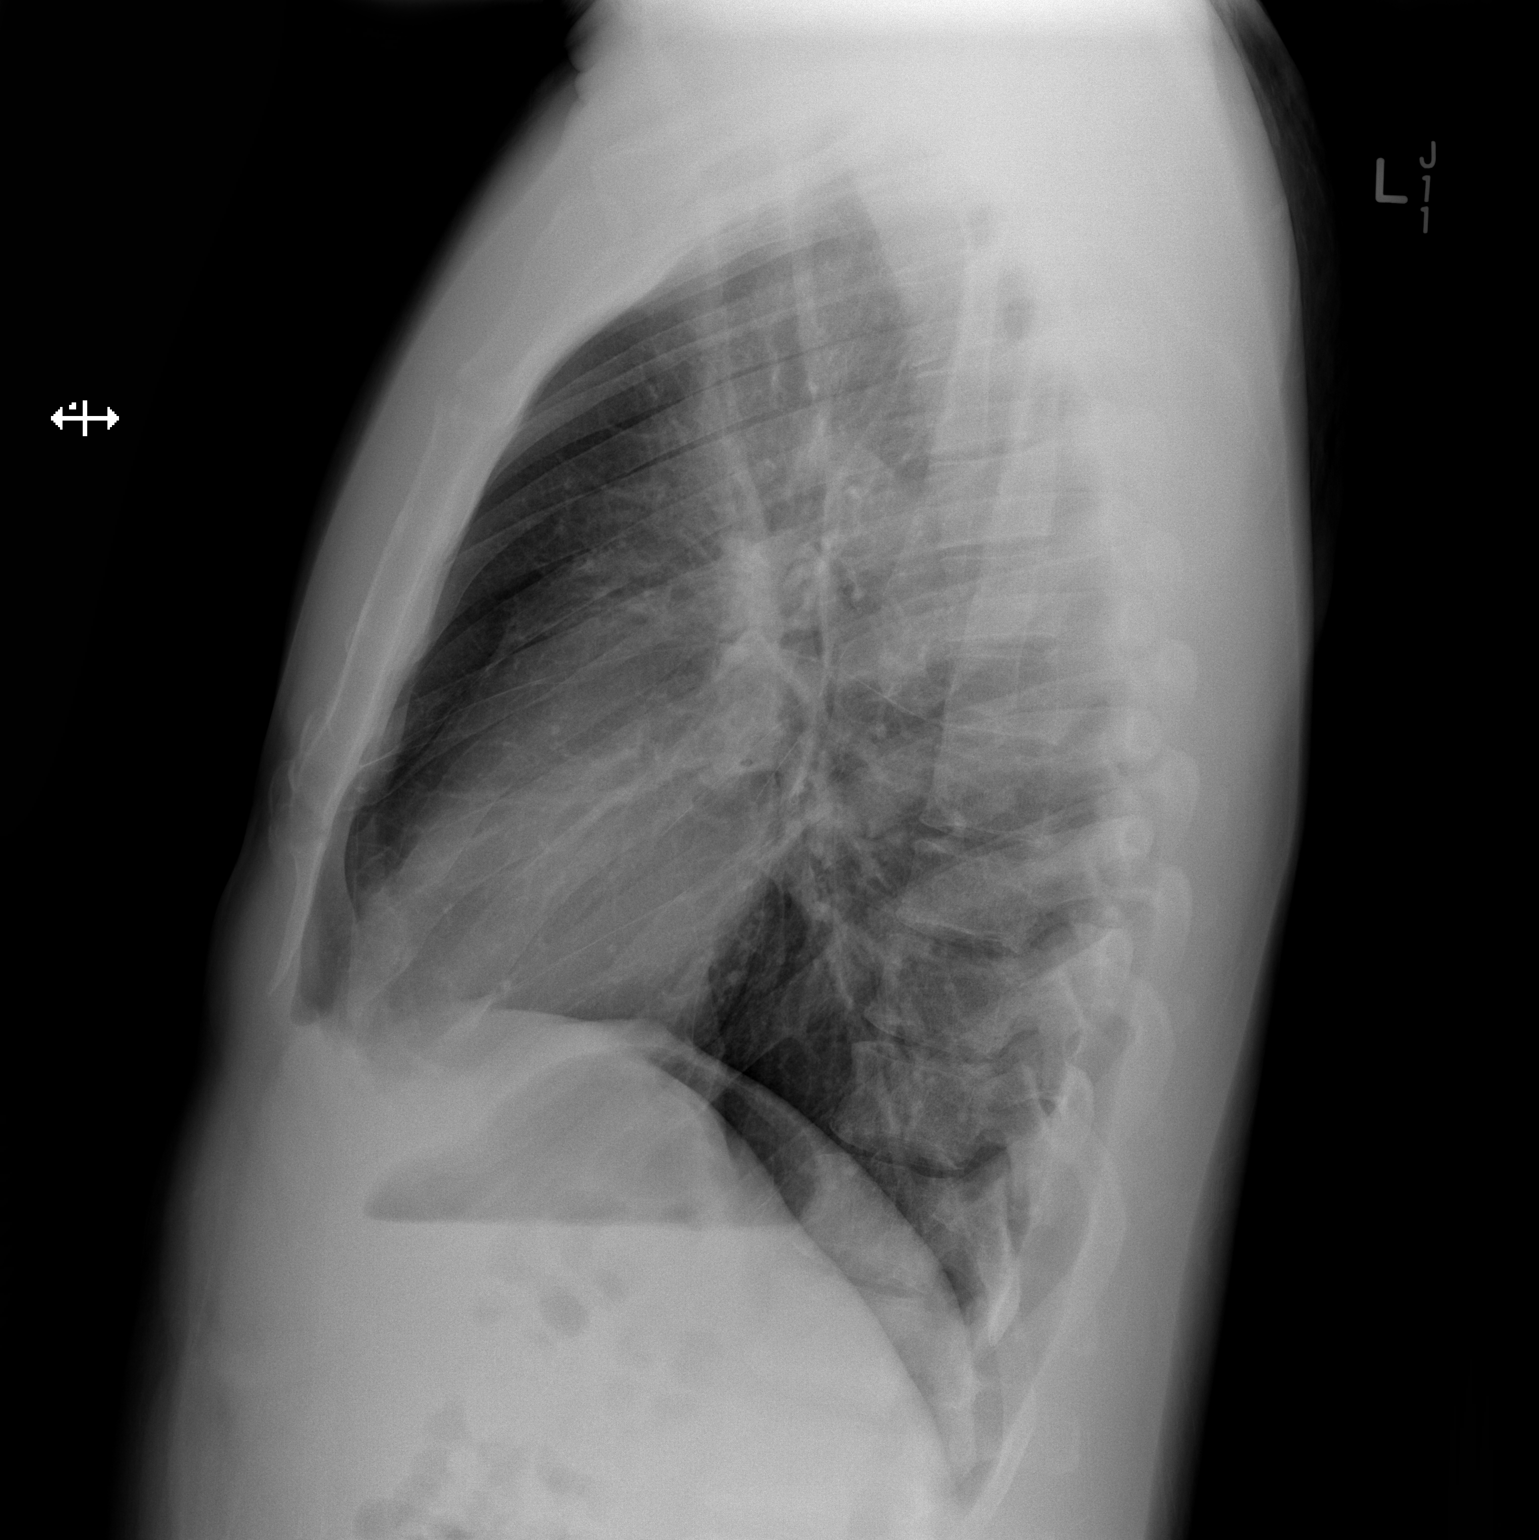

[2 of 2 positions shown; findings below may reference images not displayed]

FINDINGS: The heart size and mediastinal contours are within normal limits.
Both lungs are clear. The visualized skeletal structures are
unremarkable.
IMPRESSION: No active cardiopulmonary disease.

## 2017-03-02 IMAGING — CR DG SHOULDER 2+V*R*
3 series · 3 of 3 positions shown · non-contrast
Comparison: Right shoulder radiographs 02/06/2008

CLINICAL DATA: Right shoulder dislocation. Severe right shoulder
pain. The patient reduced the dislocation himself.

EXAM:
RIGHT SHOULDER - 2+ VIEW

[w shoulder external right]
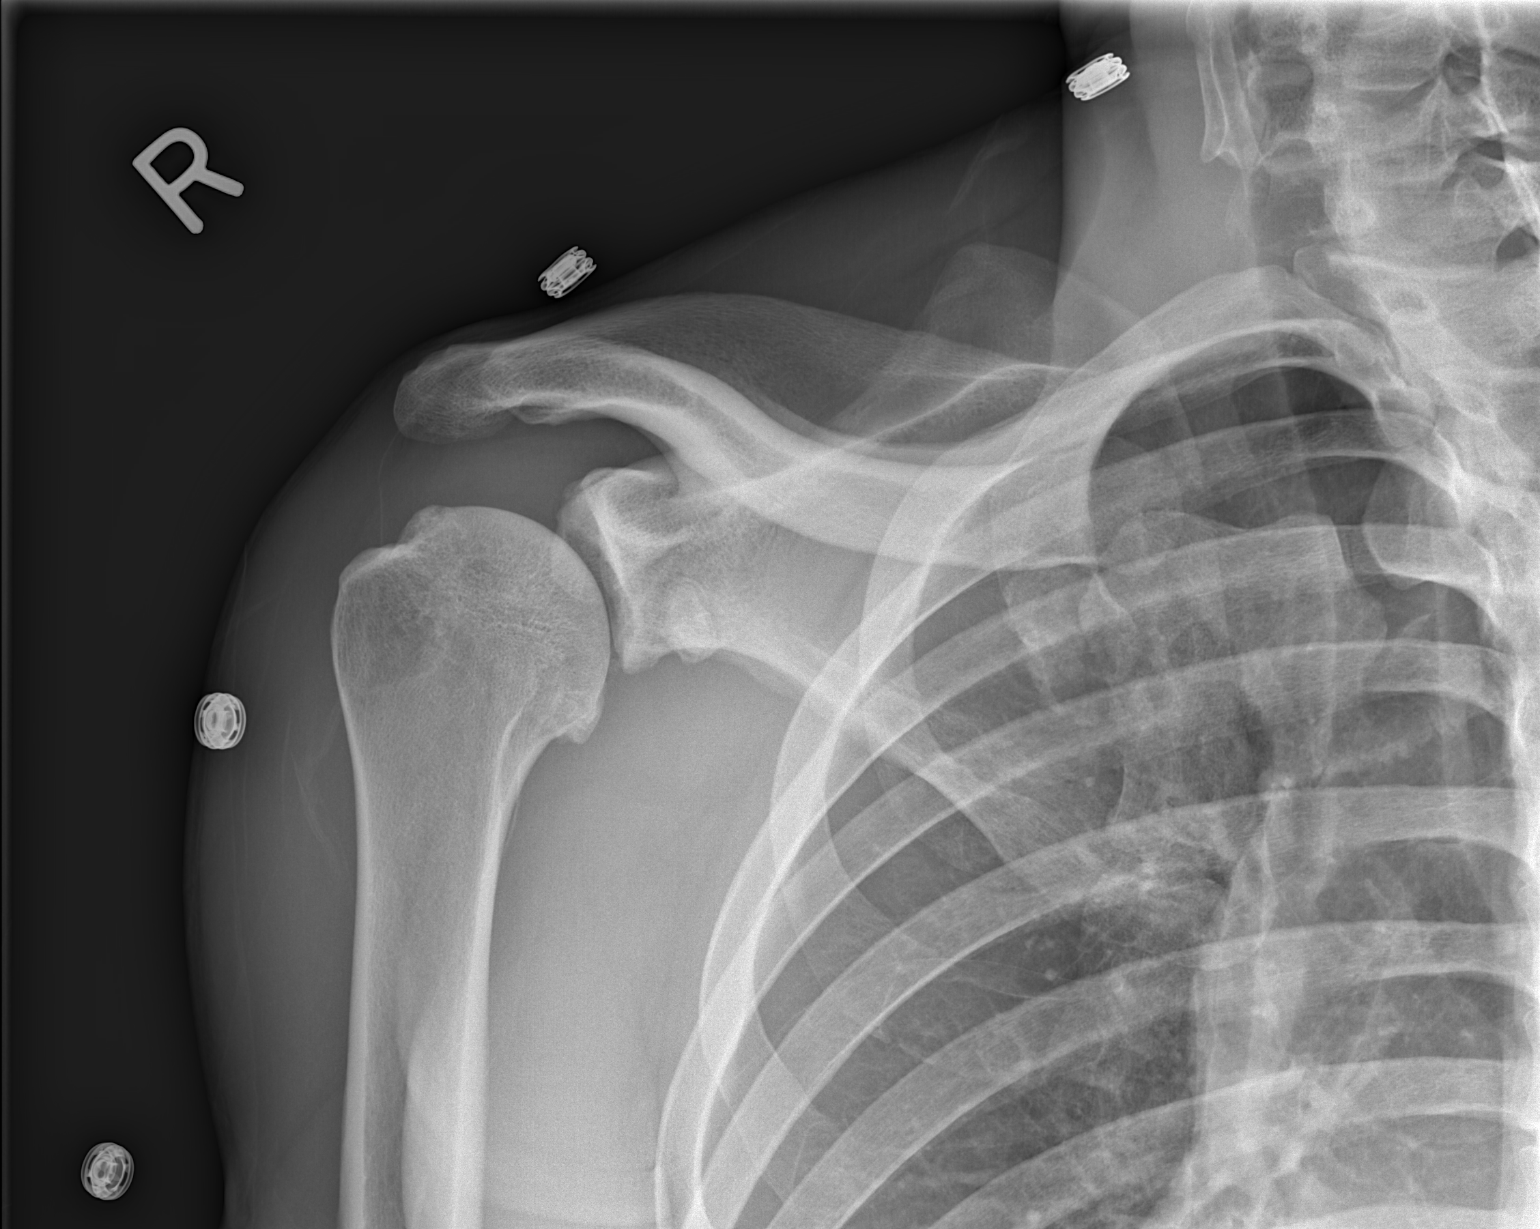

[w shoulder y-view right]
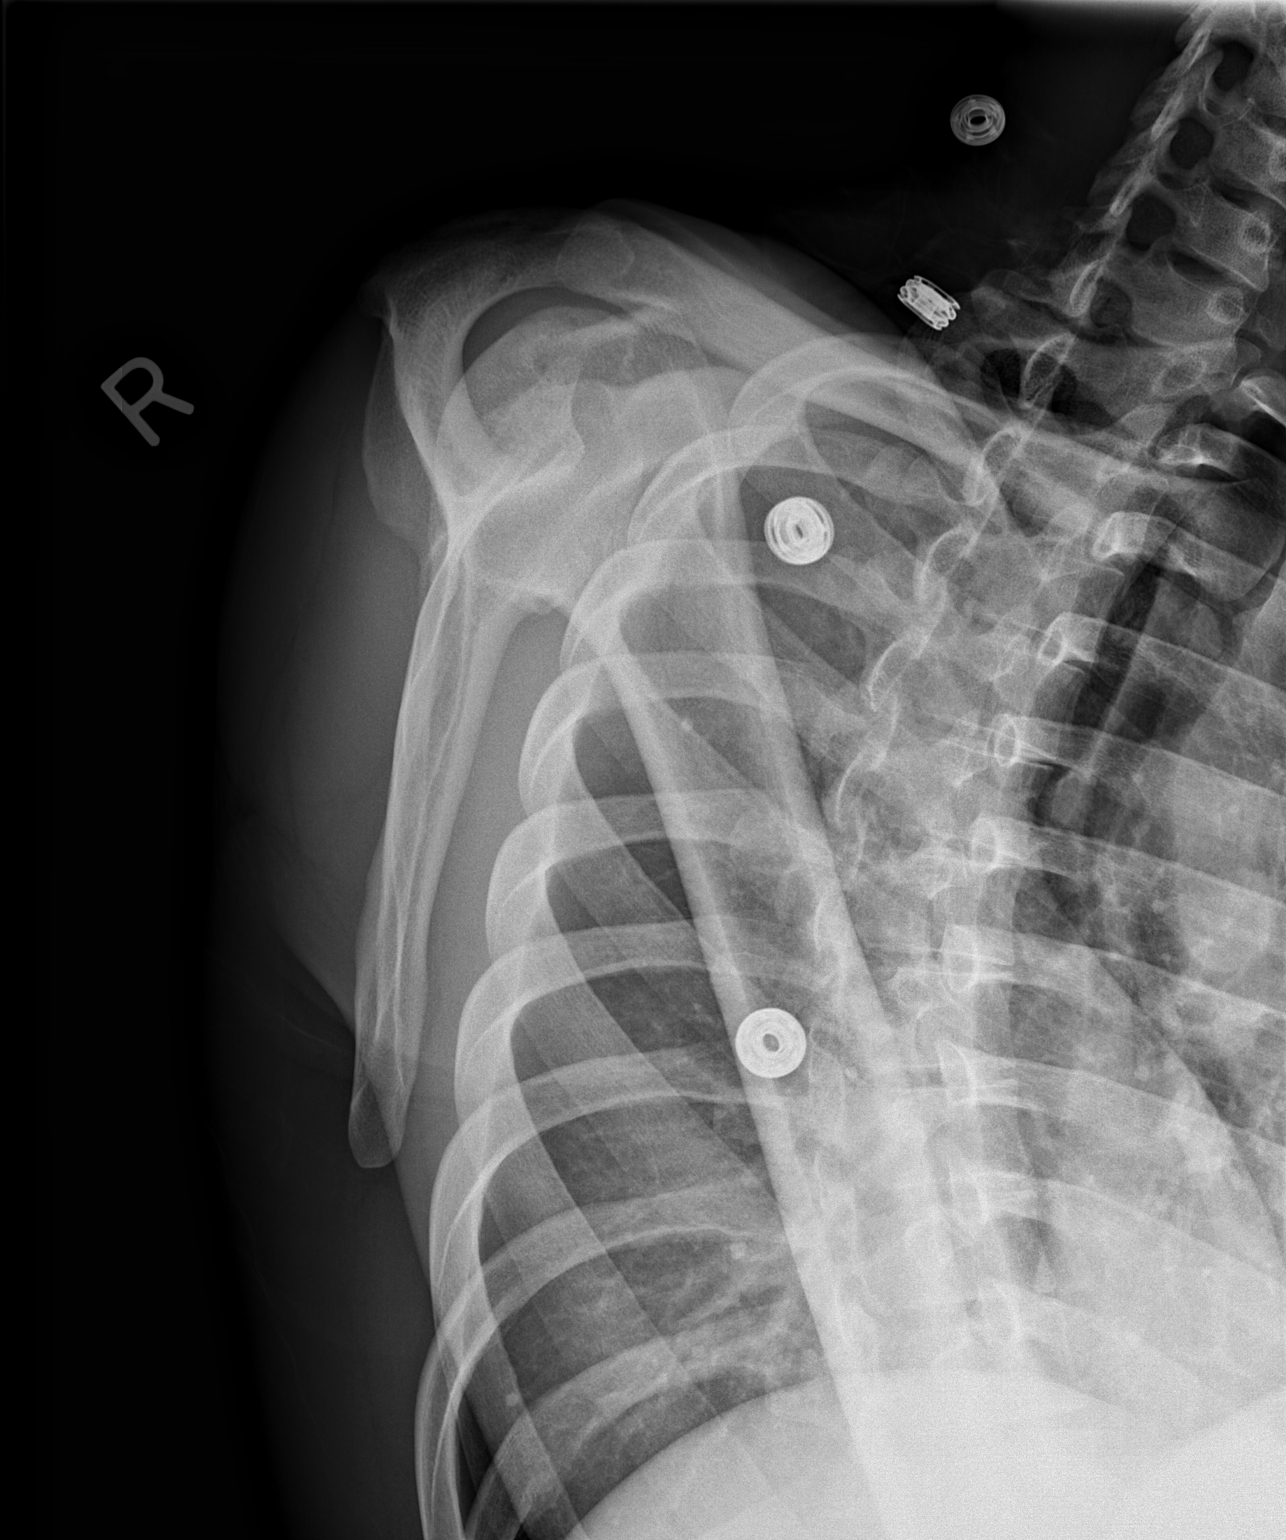

[x shoulder axillary right]
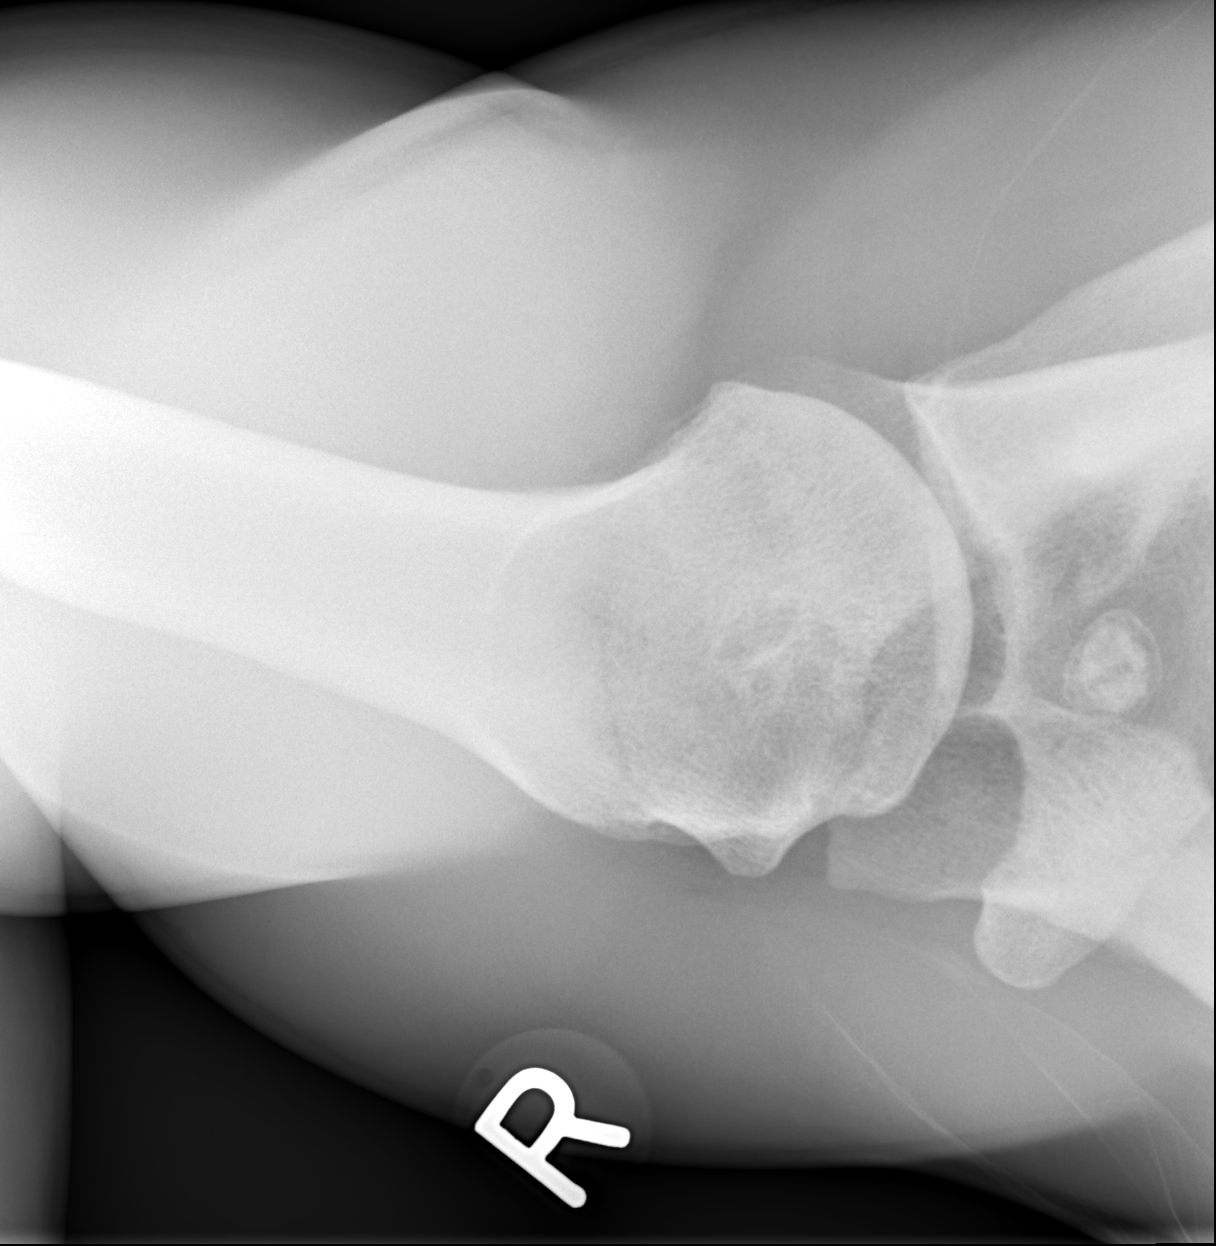

[3 of 3 positions shown; findings below may reference images not displayed]

FINDINGS: The right shoulder is located. No acute bone or soft tissue
abnormality is present. The visualized hemi thorax is clear. The
clavicle is intact.
IMPRESSION: 1. No acute osseous abnormality.

## 2017-03-22 ENCOUNTER — Ambulatory Visit: Payer: Self-pay | Admitting: Family Medicine

## 2021-07-06 ENCOUNTER — Encounter (HOSPITAL_COMMUNITY): Payer: Self-pay | Admitting: Emergency Medicine

## 2021-07-06 ENCOUNTER — Emergency Department (HOSPITAL_COMMUNITY)
Admission: EM | Admit: 2021-07-06 | Discharge: 2021-07-06 | Disposition: A | Discharge status: Home / Self Care | Payer: No Typology Code available for payment source | Attending: Emergency Medicine | Admitting: Emergency Medicine

## 2021-07-06 ENCOUNTER — Other Ambulatory Visit: Payer: Self-pay

## 2021-07-06 DIAGNOSIS — Z23 Encounter for immunization: Secondary | ICD-10-CM | POA: Diagnosis not present

## 2021-07-06 DIAGNOSIS — Y990 Civilian activity done for income or pay: Secondary | ICD-10-CM | POA: Diagnosis not present

## 2021-07-06 DIAGNOSIS — S8992XA Unspecified injury of left lower leg, initial encounter: Secondary | ICD-10-CM | POA: Diagnosis present

## 2021-07-06 DIAGNOSIS — W268XXA Contact with other sharp object(s), not elsewhere classified, initial encounter: Secondary | ICD-10-CM | POA: Insufficient documentation

## 2021-07-06 DIAGNOSIS — S81812A Laceration without foreign body, left lower leg, initial encounter: Secondary | ICD-10-CM | POA: Diagnosis not present

## 2021-07-06 MED ORDER — LIDOCAINE HCL (PF) 1 % IJ SOLN
5.0000 mL | Freq: Once | INTRAMUSCULAR | Status: AC
Start: 2021-07-06 — End: 2021-07-06
  Administered 2021-07-06: 5 mL

## 2021-07-06 MED ORDER — TETANUS-DIPHTH-ACELL PERTUSSIS 5-2.5-18.5 LF-MCG/0.5 IM SUSY
0.5000 mL | PREFILLED_SYRINGE | Freq: Once | INTRAMUSCULAR | Status: AC
  Administered 2021-07-06: 0.5 mL via INTRAMUSCULAR
  Filled 2021-07-06: qty 0.5

## 2021-07-06 MED ORDER — IBUPROFEN 800 MG PO TABS: 800.0000 mg | ORAL_TABLET | Freq: Three times a day (TID) | ORAL | 0 refills | Status: DC

## 2021-07-06 NOTE — Discharge Instructions (Signed)
Tetanus was updated today, this should be good for 7 to 10 years. ?Remove the sutures in 7 to 10 days.  He can do this at an urgent care at the ED since you have a primary care provider. ?After 24 hours wash with soap and water.  He can apply topical antibiotic ointment to the wound.  If you develop signs of infection which can include pus, spreading redness, fevers return back to the ED for evaluation ?

## 2021-07-06 NOTE — ED Provider Notes (Signed)
?[facility] EMERGENCY DEPARTMENT ?Provider Note ? ? ?CSN: [id number] ?Arrival date & time: 07/06/21  0232 ? ?  ? ?History ? ?Chief Complaint  ?Patient presents with  ? Leg Injury  ? ? ?Ronald Walker is a 41 y.o. male. ? ?HPI ? ?Patient presents with left lower extremity injury.  Patient was working at work when heart cut his left lower extremity.  Roughly 2-1/2 cm, unsure when his last tetanus was.  Denies hitting his head, no paresthesias.  Not on blood thinners. ? ?Home Medications ?Prior to Admission medications   ?Medication Sig Start Date End Date Taking? Authorizing Provider  ?acetaminophen (TYLENOL) 500 MG tablet Take 1,000 mg by mouth every 6 (six) hours as needed for moderate pain.    [provider]  ?albuterol (PROVENTIL HFA;VENTOLIN HFA) 108 (90 Base) MCG/ACT inhaler Inhale 2 puffs into the lungs every 6 (six) hours as needed for wheezing or shortness of breath.    [provider]  ?HYDROcodone-acetaminophen (NORCO/VICODIN) 5-325 MG tablet Take 1 tablet by mouth every 4 (four) hours as needed. 09/27/15   Eliseo Squires, PA-C  ?methocarbamol (ROBAXIN) 500 MG tablet Take 1 tablet (500 mg total) by mouth 2 (two) times daily. 09/27/15   Eliseo Squires, PA-C  ?naproxen (NAPROSYN) 500 MG tablet Take 1 tablet (500 mg total) by mouth 2 (two) times daily. 09/27/15   Eliseo Squires, PA-C  ?   ? ?Allergies    ?Ampicillin and Penicillins   ? ?Review of Systems   ?Review of Systems ? ?Physical Exam ?Updated Vital Signs ?BP (!) 131/91 (BP Location: Right Arm)   Pulse 67   Temp 98.3 ?F (36.8 ?C) (Oral)   Resp 18   Ht 5\' 9"  (1.753 m)   Wt 95.3 kg   SpO2 100%   BMI 31.01 kg/m?  ?Physical Exam ?Vitals and nursing note reviewed. Exam conducted with a chaperone present.  ?Constitutional:   ?   General: He is not in acute distress. ?   Appearance: Normal appearance.  ?HENT:  ?   Head: Normocephalic and atraumatic.  ?Eyes:  ?   General: No scleral icterus. ?   Extraocular  Movements: Extraocular movements intact.  ?   Pupils: Pupils are equal, round, and reactive to light.  ?Cardiovascular:  ?   Pulses: Normal pulses.  ?Musculoskeletal:     ?   General: No tenderness. Normal range of motion.  ?Skin: ?   Capillary Refill: Capillary refill takes less than 2 seconds.  ?   Coloration: Skin is not jaundiced.  ?   Comments: 2 cm laceration to anterior shin of left leg  ?Neurological:  ?   Mental Status: He is alert. Mental status is at baseline.  ?   Coordination: Coordination normal.  ? ? ?ED Results / Procedures / Treatments   ?Labs ?(all labs ordered are listed, but only abnormal results are displayed) ?Labs Reviewed - No data to display ? ?EKG ?None ? ?Radiology ?No results found. ? ?Procedures ? .Laceration Repair ? ?Date/Time: 07/06/2021 4:13 AM ?Performed by: 09/05/2021, PA-C ?Authorized by: Theron Arista, PA-C  ? ?Consent:  ?  Consent obtained:  Verbal ?  Consent given by:  Patient ?  Risks discussed:  Infection, need for additional repair, pain, poor cosmetic result and poor wound healing ?  Alternatives discussed:  No treatment and delayed treatment ?Universal protocol:  ?  Procedure explained and questions answered to patient or proxy's satisfaction: yes   ?  Relevant  documents present and verified: yes   ?  Test results available: yes   ?  Imaging studies available: yes   ?  Required blood products, implants, devices, and special equipment available: yes   ?  Site/side marked: yes   ?  Immediately prior to procedure, a time out was called: yes   ?  Patient identity confirmed:  Verbally with patient ?Anesthesia:  ?  Anesthesia method:  Local infiltration ?  Local anesthetic:  Lidocaine 1% w/o epi ?Laceration details:  ?  Location:  Leg ?  Leg location:  L lower leg ?  Length (cm):  2 ?  Depth (mm):  2 ?Pre-procedure details:  ?  Preparation:  Patient was prepped and draped in usual sterile fashion ?Exploration:  ?  Limited defect created (wound extended): no   ?  Hemostasis achieved  with:  Direct pressure ?  Contaminated: no   ?Treatment:  ?  Area cleansed with:  Povidone-iodine ?Skin repair:  ?  Repair method:  Sutures ?  Suture size:  4-0 ?  Suture material:  Nylon ?  Suture technique:  Simple interrupted ?  Number of sutures:  2 ?Approximation:  ?  Approximation:  Close ?Repair type:  ?  Repair type:  Simple ?Post-procedure details:  ?  Dressing:  Antibiotic ointment and adhesive bandage ?  Procedure completion:  Tolerated well, no immediate complications  ? ? ?Medications Ordered in ED ?Medications  ?lidocaine (PF) (XYLOCAINE) 1 % injection 5 mL (has no administration in time range)  ?Tdap (BOOSTRIX) injection 0.5 mL (has no administration in time range)  ? ? ?ED Course/ Medical Decision Making/ A&P ?  ?                        ?Medical Decision Making ?Risk ?Prescription drug management. ? ? ?Patient is a 41 year old male presenting with laceration.  He is neurovascularly intact with DP PT 2+.  Brisk cap refill, able to tolerate ROM to ankle and knee without difficulty.  Patient's tetanus is up-to-date, this was corrected today.  Laceration repaired, patient tolerated procedure well.  Considered imaging but given superficial nature of the injury and no focal tenderness will obtain at this time.  Return precautions given, discharged in stable condition. ? ? ? ? ? ? ? ?Final Clinical Impression(s) / ED Diagnoses ?Final diagnoses:  ?None  ? ? ?Rx / DC Orders ?ED Discharge Orders   ? ? None  ? ?  ? ? ?  ?Theron Arista, PA-C ?07/06/21 7353 ? ?  ?Tilden Fossa, MD ?07/06/21 [phone] ? ?

## 2021-07-06 NOTE — ED Triage Notes (Signed)
Pt c/o left lower leg pain with laceration yesterday at work  ?

## 2021-07-13 ENCOUNTER — Ambulatory Visit (HOSPITAL_COMMUNITY)
Admission: EM | Admit: 2021-07-13 | Discharge: 2021-07-13 | Disposition: A | Discharge status: Home / Self Care | Payer: Self-pay | Attending: Internal Medicine | Admitting: Internal Medicine

## 2021-07-13 NOTE — ED Triage Notes (Signed)
Removed 1 suture from lt lower leg. No s/sx's of infection.  ?

## 2022-07-26 ENCOUNTER — Telehealth: Payer: Self-pay | Admitting: General Practice

## 2022-07-26 ENCOUNTER — Ambulatory Visit (HOSPITAL_COMMUNITY)
Admission: EM | Admit: 2022-07-26 | Discharge: 2022-07-26 | Disposition: A | Discharge status: Home / Self Care | Payer: Commercial Managed Care - HMO | Attending: Sports Medicine | Admitting: Sports Medicine

## 2022-07-26 ENCOUNTER — Encounter (HOSPITAL_COMMUNITY): Payer: Self-pay | Admitting: *Deleted

## 2022-07-26 DIAGNOSIS — M1990 Unspecified osteoarthritis, unspecified site: Secondary | ICD-10-CM

## 2022-07-26 DIAGNOSIS — S39012A Strain of muscle, fascia and tendon of lower back, initial encounter: Secondary | ICD-10-CM | POA: Diagnosis not present

## 2022-07-26 DIAGNOSIS — M79672 Pain in left foot: Secondary | ICD-10-CM | POA: Diagnosis not present

## 2022-07-26 MED ORDER — METHOCARBAMOL 500 MG PO TABS: 500.0000 mg | ORAL_TABLET | Freq: Two times a day (BID) | ORAL | 0 refills | Status: DC

## 2022-07-26 MED ORDER — IBUPROFEN 800 MG PO TABS: 800.0000 mg | ORAL_TABLET | Freq: Three times a day (TID) | ORAL | 0 refills | Status: DC

## 2022-07-26 NOTE — ED Provider Notes (Signed)
[facility]    CSN: [id number] Arrival date & time: 07/26/22  1008      History   Chief Complaint Chief Complaint  Patient presents with   Back Pain   Foot Pain    HPI Ronald Walker is a 42 y.o. male.   He is here today with chief complaint of bilateral low back pain and left foot pain.  He reports he has had back pain in the past and he works for a [employer] with lots of heavy lifting.  He reports about 6 months ago he strained his back a couple days off and his pain improved.  His pain returned after he strained his back again at work yesterday.  He took down today but is still having a lot of pain.  His pain is worse when he bends forward he describes it as a stretching pain in his low back right side greater than left.  He denies any radiation of pain down into his legs, loss of bowel or bladder control or weakness. He also would like to discuss his left foot pain.  He reports has had left foot pain for approximately a year ever since he was in prison.  He describes his pain as sharp pain on the bottom of his foot in his arch and he feels knots there.  Sometimes he is unable to wear his work boots secondary to the pain.  He has not tried any medications for this pain and denies any injury to the area.  Requesting a work note.   Back Pain Foot Pain   Past Medical History:  Diagnosis Date   Asthma     Patient Active Problem List   Diagnosis Date Noted   Arthritis 07/26/2022    Past Surgical History:  Procedure Laterality Date   ABDOMINAL SURGERY         Home Medications    Prior to Admission medications   Medication Sig Start Date End Date Taking? Authorizing Provider  albuterol (PROVENTIL HFA;VENTOLIN HFA) 108 (90 Base) MCG/ACT inhaler Inhale 2 puffs into the lungs every 6 (six) hours as needed for wheezing or shortness of breath.   Yes [provider]  acetaminophen (TYLENOL) 500 MG tablet Take 1,000 mg by mouth every 6 (six) hours as  needed for moderate pain.    [provider]  HYDROcodone-acetaminophen (NORCO/VICODIN) 5-325 MG tablet Take 1 tablet by mouth every 4 (four) hours as needed. 09/27/15   Eliseo Squires, PA-C  ibuprofen (ADVIL) 800 MG tablet Take 1 tablet (800 mg total) by mouth 3 (three) times daily. 07/06/21   Theron Arista, PA-C  methocarbamol (ROBAXIN) 500 MG tablet Take 1 tablet (500 mg total) by mouth 2 (two) times daily. 09/27/15   Eliseo Squires, PA-C    Family History History reviewed. No pertinent family history.  Social History Social History   Tobacco Use   Smoking status: Every Day    Packs/day: .5    Types: Cigarettes   Smokeless tobacco: Never  Vaping Use   Vaping Use: Never used  Substance Use Topics   Alcohol use: Yes    Alcohol/week: 7.0 standard drinks of alcohol    Types: 7 Shots of liquor per week   Drug use: Yes    Types: Marijuana     Allergies   Ampicillin and Penicillins   Review of Systems Review of Systems  Musculoskeletal:  Positive for back pain.  As listed above in HPI.  Physical Exam Triage  Vital Signs ED Triage Vitals  Enc Vitals Group     BP 07/26/22 1057 (!) 138/98     Pulse Rate 07/26/22 1057 64     Resp 07/26/22 1057 18     Temp 07/26/22 1057 98 F (36.7 C)     Temp Source 07/26/22 1057 Oral     SpO2 07/26/22 1057 97 %     Weight --      Height --      Head Circumference --      Peak Flow --      Pain Score 07/26/22 1050 7     Pain Loc --      Pain Edu? --      Excl. in GC? --    No data found.  Updated Vital Signs BP (!) 138/98 (BP Location: Right Arm)   Pulse 64   Temp 98 F (36.7 C) (Oral)   Resp 18   SpO2 97%   Physical Exam Vitals reviewed.  Constitutional:      General: He is not in acute distress.    Appearance: Normal appearance. He is normal weight. He is not ill-appearing, toxic-appearing or diaphoretic.  Cardiovascular:     Rate and Rhythm: Normal rate.  Pulmonary:     Effort: Pulmonary effort is  normal.  Neurological:     Mental Status: He is alert.  Psychiatric:        Mood and Affect: Mood normal.        Behavior: Behavior normal.        Thought Content: Thought content normal.        Judgment: Judgment normal.   Low back: No obvious deformity or asymmetry.  No ecchymosis or edema.  No tenderness to palpation of the midline lumbar spine.  No appreciable step-off.  Tenderness to palpation paraspinals and quadratus lumborum right greater than left.  Negative seated straight leg raise test.  Strength 5/5 resisted hip flexion.  Sensation intact to light touch.  Normal gait Left foot: He has some hammering of his fifth toe, pes planus.  Palpable firm nodules in the arch of his foot.  Dorsalis pedis 2+.  Hallux valgus.  Tenderness to palpation of the origin of the plantar fascia   UC Treatments / Results  Labs (all labs ordered are listed, but only abnormal results are displayed) Labs Reviewed - No data to display  EKG   Radiology No results found.  Procedures Procedures (including critical care time)  Medications Ordered in UC Medications - No data to display  Initial Impression / Assessment and Plan / UC Course  I have reviewed the triage vital signs and the nursing notes.  Pertinent labs & imaging results that were available during my care of the patient were reviewed by me and considered in my medical decision making (see chart for details).     Lumbar strain.  Patient has had now 2 separate lumbar strains to his low back while working at a [employer].  I sent to his pharmacy some ibuprofen as well as Robaxin to help with some muscle spasms.  He was also provided some gentle stretches and exercises to perform regularly till his pain has resolved.  Recommend follow-up with primary care provider symptoms worsen or fail to improve Left foot plantar fibromas versus Planter fasciitis pain.  Recommend he try some over-the-counter arch support, ice the foot after work  regularly and perform the stretches and exercises that were given to him.  Should his pain not  improve he is a good candidate for custom orthotics.  Information was provided for [facility].  He verbalized understanding. Final Clinical Impressions(s) / UC Diagnoses   Final diagnoses:  None   Discharge Instructions   None    ED Prescriptions   None    PDMP not reviewed this encounter.   Claudie Leach, DO 07/26/22 1303

## 2022-07-26 NOTE — Telephone Encounter (Signed)
Pt has a NP appt for 08/09/22 with Salvatore Decent for a hosp f/up scheduled via mychart.  Left foot pain +1 more Clinical impression  07/26/2022 (54 minutes)[facility]

## 2022-07-26 NOTE — Discharge Instructions (Addendum)
For your left foot pain I recommend you use ice can freeze a water bottle and roll it under your foot and also get some over-the-counter arch support.  I provided you with some stretches and exercises to perform as well.  Follow-up with a [facility] center if this does not resolve any of your foot pain. In the setting of your back you have likely strained a muscle.  I have sent to your pharmacy ibuprofen and a muscle relaxer, Robaxin that you can take to help with your pain.  I provided you with some stretches and exercises that we need to begin for your back as well.  Follow-up with your primary care provider if symptoms worsen or fail to improve

## 2022-07-26 NOTE — ED Triage Notes (Signed)
Pt states he has left foot pain, mainly in his arch he states he has knots in it since he was in prison but it has been worse x 1 year.   He states he has back pain lower mostly x 6 months. He states it was getting better but he pulled it at work the other night. He has been taking IBU

## 2022-07-31 NOTE — Telephone Encounter (Signed)
We do want to call prior to new patient appt if pt had a hospital admission. It pt had ER visit we do not need to call prior.

## 2022-08-07 NOTE — Progress Notes (Deleted)
  [facility] [address] [place] [address] Dept: [phone] Dept Fax: [phone]  New Patient Office Visit  Subjective:   Ronald Walker [birth year] 08/09/2022  No chief complaint on file.   HPI: Ronald Walker presents today to establish care at [facility]. Introduced to Publishing rights manager role and practice setting.  All questions answered.   Last PCP: *** Last annual physical: *** Concerns: ***   The following portions of the patient's history were reviewed and updated as appropriate: past medical history, past surgical history, family history, social history, allergies, medications, and problem list.   Patient Active Problem List   Diagnosis Date Noted   Arthritis 07/26/2022   Past Medical History:  Diagnosis Date   Asthma    Past Surgical History:  Procedure Laterality Date   ABDOMINAL SURGERY     No family history on file. Outpatient Medications Prior to Visit  Medication Sig Dispense Refill   acetaminophen (TYLENOL) 500 MG tablet Take 1,000 mg by mouth every 6 (six) hours as needed for moderate pain.     albuterol (PROVENTIL HFA;VENTOLIN HFA) 108 (90 Base) MCG/ACT inhaler Inhale 2 puffs into the lungs every 6 (six) hours as needed for wheezing or shortness of breath.     HYDROcodone-acetaminophen (NORCO/VICODIN) 5-325 MG tablet Take 1 tablet by mouth every 4 (four) hours as needed. 6 tablet 0   ibuprofen (ADVIL) 800 MG tablet Take 1 tablet (800 mg total) by mouth 3 (three) times daily. 30 tablet 0   methocarbamol (ROBAXIN) 500 MG tablet Take 1 tablet (500 mg total) by mouth 2 (two) times daily. 21 tablet 0   No facility-administered medications prior to visit.   Allergies  Allergen Reactions   Ampicillin     Swelling    Penicillins     Swelling Has patient had a PCN reaction causing immediate rash, facial/tongue/throat swelling, SOB or lightheadedness with  hypotension: unknown Has patient had a PCN reaction causing severe rash involving mucus membranes or skin necrosis: unknown Has patient had a PCN reaction that required hospitalization unknown Has patient had a PCN reaction occurring within the last 10 years: no If all of the above answers are "NO", then may proceed with Cephalosporin use.     ROS: A complete ROS was performed with pertinent positives/negatives noted in the HPI. The remainder of the ROS are negative.   Objective:   There were no vitals filed for this visit.  GENERAL: Well-appearing, in NAD. Well nourished.  SKIN: Pink, warm and dry. No rash, lesion, ulceration, or ecchymoses.  NECK: Trachea midline. Full ROM w/o pain or tenderness. No lymphadenopathy.  RESPIRATORY: Chest wall symmetrical. Respirations even and non-labored. Breath sounds clear to auscultation bilaterally.  CARDIAC: S1, S2 present, regular rate and rhythm. Peripheral pulses 2+ bilaterally.  MSK: Muscle tone and strength appropriate for age. Joints w/o tenderness, redness, or swelling.  EXTREMITIES: Without clubbing, cyanosis, or edema.  NEUROLOGIC: No motor or sensory deficits. Steady, even gait.  PSYCH/MENTAL STATUS: Alert, oriented x 3. Cooperative, appropriate mood and affect.   Health Maintenance Due  Topic Date Due   COVID-19 Vaccine (1) Never done   HIV Screening  Never done   Hepatitis C Screening  Never done    No results found for any visits on 08/09/22.     Assessment & Plan:  There are no diagnoses linked to this encounter.   No follow-ups on file.   Salvatore Decent, FNP

## 2022-08-09 ENCOUNTER — Ambulatory Visit: Payer: Self-pay | Admitting: Internal Medicine

## 2022-08-09 DIAGNOSIS — Z7689 Persons encountering health services in other specified circumstances: Secondary | ICD-10-CM

## 2023-01-03 ENCOUNTER — Encounter (HOSPITAL_COMMUNITY): Payer: Self-pay | Admitting: Emergency Medicine

## 2023-01-03 ENCOUNTER — Ambulatory Visit (HOSPITAL_COMMUNITY)
Admission: EM | Admit: 2023-01-03 | Discharge: 2023-01-03 | Disposition: A | Discharge status: Home / Self Care | Payer: Managed Care, Other (non HMO) | Attending: Internal Medicine | Admitting: Internal Medicine

## 2023-01-03 DIAGNOSIS — J4520 Mild intermittent asthma, uncomplicated: Secondary | ICD-10-CM | POA: Insufficient documentation

## 2023-01-03 DIAGNOSIS — J069 Acute upper respiratory infection, unspecified: Secondary | ICD-10-CM | POA: Diagnosis present

## 2023-01-03 DIAGNOSIS — Z1152 Encounter for screening for COVID-19: Secondary | ICD-10-CM | POA: Diagnosis not present

## 2023-01-03 MED ORDER — BENZONATATE 100 MG PO CAPS: 100.0000 mg | ORAL_CAPSULE | Freq: Three times a day (TID) | ORAL | 0 refills | Status: DC

## 2023-01-03 MED ORDER — ALBUTEROL SULFATE HFA 108 (90 BASE) MCG/ACT IN AERS
1.0000 | INHALATION_SPRAY | Freq: Four times a day (QID) | RESPIRATORY_TRACT | 0 refills | Status: AC | PRN
Start: 2023-01-03 — End: ?

## 2023-01-03 NOTE — Discharge Instructions (Signed)
You have a viral illness which will improve on its own with rest, fluids, and medications to help with your symptoms. We discussed prescriptions that may help with your symptoms: tessalon perles as needed, albuterol as needed You may use over the counter medicines as needed: tylenol/motrin, mucinex, zyrtec, Flonase Two teaspoons of honey in 1 cup of warm water every 4-6 hours may help with throat pains. Humidifier in room at nighttime may help soothe cough (clean well daily).   For chest pain, shortness of breath, inability to keep food or fluids down without vomiting, fever that does not respond to tylenol or motrin, or any other severe symptoms, please go to the ER for further evaluation. Return to urgent care as needed, otherwise follow-up with PCP.

## 2023-01-03 NOTE — ED Triage Notes (Signed)
Pt c/o cough that is productive, headache and body aches. Took Excedrin for headache that helped for about hour and tried Nyquil today.  Reports was sent home from work yesterday.

## 2023-01-03 NOTE — ED Provider Notes (Signed)
[facility]    CSN: [id number] Arrival date & time: 01/03/23  1918      History   Chief Complaint Chief Complaint  Patient presents with   Headache   Generalized Body Aches   Cough    HPI Ronald Walker is a 42 y.o. male.   Patient is to urgent care for evaluation of cough, generalized bodyaches, nasal congestion, sore throat, and headache that started yesterday abruptly on January 02, 2023.  Cough is dry and nonproductive.  Headache is generalized and has not responded very well to Excedrin use.  No recent sick contacts with similar symptoms.  Denies shortness of breath, chest pain, heart palpitations, nausea, vomiting, diarrhea, abdominal pain, rash, and fever/chills.  History of asthma, would like refill of albuterol inhaler.  He smokes marijuana frequently, denies other drug use including nicotine/vaping use.  Taking Excedrin and NyQuil with some relief of symptoms.   Headache Associated symptoms: cough   Cough Associated symptoms: headaches     Past Medical History:  Diagnosis Date   Asthma     Patient Active Problem List   Diagnosis Date Noted   Arthritis 07/26/2022    Past Surgical History:  Procedure Laterality Date   ABDOMINAL SURGERY         Home Medications    Prior to Admission medications   Medication Sig Start Date End Date Taking? Authorizing Provider  albuterol (VENTOLIN HFA) 108 (90 Base) MCG/ACT inhaler Inhale 1-2 puffs into the lungs every 6 (six) hours as needed for wheezing or shortness of breath. 01/03/23  Yes Carlisle Beers, FNP  benzonatate (TESSALON) 100 MG capsule Take 1 capsule (100 mg total) by mouth every 8 (eight) hours. 01/03/23  Yes Carlisle Beers, FNP  acetaminophen (TYLENOL) 500 MG tablet Take 1,000 mg by mouth every 6 (six) hours as needed for moderate pain.    [provider]  HYDROcodone-acetaminophen (NORCO/VICODIN) 5-325 MG tablet Take 1 tablet by mouth every 4 (four) hours as needed.  09/27/15   Eliseo Squires, PA-C  ibuprofen (ADVIL) 800 MG tablet Take 1 tablet (800 mg total) by mouth 3 (three) times daily. 07/26/22   Claudie Leach, DO  methocarbamol (ROBAXIN) 500 MG tablet Take 1 tablet (500 mg total) by mouth 2 (two) times daily. 07/26/22   Claudie Leach, DO    Family History No family history on file.  Social History Social History   Tobacco Use   Smoking status: Every Day    Current packs/day: 0.50    Types: Cigarettes   Smokeless tobacco: Never  Vaping Use   Vaping status: Never Used  Substance Use Topics   Alcohol use: Yes    Alcohol/week: 7.0 standard drinks of alcohol    Types: 7 Shots of liquor per week   Drug use: Yes    Types: Marijuana     Allergies   Ampicillin and Penicillins   Review of Systems Review of Systems  Respiratory:  Positive for cough.   Neurological:  Positive for headaches.  Per HPI   Physical Exam Triage Vital Signs ED Triage Vitals  Encounter Vitals Group     BP 01/03/23 1939 120/86     Systolic BP Percentile --      Diastolic BP Percentile --      Pulse Rate 01/03/23 1939 86     Resp 01/03/23 1939 16     Temp 01/03/23 1939 99.1 F (37.3 C)     Temp Source 01/03/23 1939 Oral  SpO2 01/03/23 1939 97 %     Weight --      Height --      Head Circumference --      Peak Flow --      Pain Score 01/03/23 1938 10     Pain Loc --      Pain Education --      Exclude from Growth Chart --    No data found.  Updated Vital Signs BP 120/86 (BP Location: Right Arm)   Pulse 86   Temp 99.1 F (37.3 C) (Oral)   Resp 16   SpO2 97%   Visual Acuity Right Eye Distance:   Left Eye Distance:   Bilateral Distance:    Right Eye Near:   Left Eye Near:    Bilateral Near:     Physical Exam Vitals and nursing note reviewed.  Constitutional:      Appearance: He is not ill-appearing or toxic-appearing.  HENT:     Head: Normocephalic and atraumatic.     Right Ear: Hearing, tympanic membrane, ear canal  and external ear normal.     Left Ear: Hearing, tympanic membrane, ear canal and external ear normal.     Nose: Congestion present.     Mouth/Throat:     Lips: Pink.     Mouth: Mucous membranes are moist. No injury.     Tongue: No lesions. Tongue does not deviate from midline.     Palate: No mass and lesions.     Pharynx: Oropharynx is clear. Uvula midline. Posterior oropharyngeal erythema present. No pharyngeal swelling, oropharyngeal exudate or uvula swelling.     Tonsils: No tonsillar exudate or tonsillar abscesses.  Eyes:     General: Lids are normal. Vision grossly intact. Gaze aligned appropriately.     Extraocular Movements: Extraocular movements intact.     Conjunctiva/sclera: Conjunctivae normal.  Cardiovascular:     Rate and Rhythm: Normal rate and regular rhythm.     Heart sounds: Normal heart sounds, S1 normal and S2 normal.  Pulmonary:     Effort: Pulmonary effort is normal. No respiratory distress.     Breath sounds: Normal breath sounds and air entry. No wheezing, rhonchi or rales.  Chest:     Chest wall: No tenderness.  Abdominal:     Palpations: Abdomen is soft.  Musculoskeletal:     Cervical back: Neck supple.  Skin:    General: Skin is warm and dry.     Capillary Refill: Capillary refill takes less than 2 seconds.     Findings: No rash.  Neurological:     General: No focal deficit present.     Mental Status: He is alert and oriented to person, place, and time. Mental status is at baseline.     Cranial Nerves: No dysarthria or facial asymmetry.  Psychiatric:        Mood and Affect: Mood normal.        Speech: Speech normal.        Behavior: Behavior normal.        Thought Content: Thought content normal.        Judgment: Judgment normal.      UC Treatments / Results  Labs (all labs ordered are listed, but only abnormal results are displayed) Labs Reviewed  SARS CORONAVIRUS 2 (TAT 6-24 HRS)    EKG   Radiology No results  found.  Procedures Procedures (including critical care time)  Medications Ordered in UC Medications - No data to display  Initial  Impression / Assessment and Plan / UC Course  I have reviewed the triage vital signs and the nursing notes.  Pertinent labs & imaging results that were available during my care of the patient were reviewed by me and considered in my medical decision making (see chart for details).   1. Viral URI with cough, mild intermittent asthma uncomplicated Suspect viral URI, viral syndrome. Physical exam findings reassuring, vital signs hemodynamically stable. Low suspicion for pneumonia/acute cardiopulmonary abnormality, therefore deferred imaging of the chest. Advised supportive care, offered prescriptions for symptomatic relief.  Recommend continued use of OTC medications as needed, recommendations discussed with patient/caregiver and outlined in AVS.  Strep/viral testing: COVID pending, he is a candidate for antiviral therapy. Albuterol inhaler refilled.  No current signs of asthma acute exacerbation.  Counseled patient on potential for adverse effects with medications prescribed/recommended today, strict ER and return-to-clinic precautions discussed, patient verbalized understanding.    Final Clinical Impressions(s) / UC Diagnoses   Final diagnoses:  Viral URI with cough  Mild intermittent asthma without complication     Discharge Instructions      You have a viral illness which will improve on its own with rest, fluids, and medications to help with your symptoms. We discussed prescriptions that may help with your symptoms: tessalon perles as needed, albuterol as needed You may use over the counter medicines as needed: tylenol/motrin, mucinex, zyrtec, Flonase Two teaspoons of honey in 1 cup of warm water every 4-6 hours may help with throat pains. Humidifier in room at nighttime may help soothe cough (clean well daily).   For chest pain, shortness of  breath, inability to keep food or fluids down without vomiting, fever that does not respond to tylenol or motrin, or any other severe symptoms, please go to the ER for further evaluation. Return to urgent care as needed, otherwise follow-up with PCP.      ED Prescriptions     Medication Sig Dispense Auth. Provider   albuterol (VENTOLIN HFA) 108 (90 Base) MCG/ACT inhaler Inhale 1-2 puffs into the lungs every 6 (six) hours as needed for wheezing or shortness of breath. 8 g Reita May M, FNP   benzonatate (TESSALON) 100 MG capsule Take 1 capsule (100 mg total) by mouth every 8 (eight) hours. 21 capsule Carlisle Beers, FNP      PDMP not reviewed this encounter.   Carlisle Beers, [place] 01/03/23 1956

## 2023-01-04 LAB — SARS CORONAVIRUS 2 (TAT 6-24 HRS): SARS Coronavirus 2: NEGATIVE

## 2023-04-10 ENCOUNTER — Encounter (HOSPITAL_COMMUNITY): Payer: Self-pay

## 2023-04-10 ENCOUNTER — Ambulatory Visit (INDEPENDENT_AMBULATORY_CARE_PROVIDER_SITE_OTHER): Payer: Commercial Managed Care - HMO

## 2023-04-10 ENCOUNTER — Ambulatory Visit (HOSPITAL_COMMUNITY)
Admission: EM | Admit: 2023-04-10 | Discharge: 2023-04-10 | Disposition: A | Discharge status: Home / Self Care | Payer: Commercial Managed Care - HMO | Attending: Emergency Medicine | Admitting: Emergency Medicine

## 2023-04-10 DIAGNOSIS — M79642 Pain in left hand: Secondary | ICD-10-CM

## 2023-04-10 MED ORDER — IBUPROFEN 800 MG PO TABS: 800.0000 mg | ORAL_TABLET | Freq: Three times a day (TID) | ORAL | 0 refills | Status: AC

## 2023-04-10 MED ORDER — SULFAMETHOXAZOLE-TRIMETHOPRIM 800-160 MG PO TABS: 1.0000 | ORAL_TABLET | Freq: Two times a day (BID) | ORAL | 0 refills | Status: AC

## 2023-04-10 MED ORDER — ACETAMINOPHEN 325 MG PO TABS
ORAL_TABLET | ORAL | Status: AC
  Filled 2023-04-10: qty 3

## 2023-04-10 MED ORDER — ACETAMINOPHEN 325 MG PO TABS
975.0000 mg | ORAL_TABLET | Freq: Once | ORAL | Status: AC
  Administered 2023-04-10: 975 mg via ORAL

## 2023-04-10 NOTE — ED Triage Notes (Signed)
 Pt c/o left hand swelling, hand left hand pain, and intermittent tingling. Pain radiates from tips of his fingers to wrist.   Start Date: 04/08/2023   Home Interventions: Ice, with some relief; Ibuprofen , with no relief

## 2023-04-10 NOTE — Discharge Instructions (Signed)
 Your imaging did not show any bony abnormalities. Take all antibiotics as prescribed, can take with food to help with stomach upset. Keep your hand elevated to help with swelling and take ibuprofen  every 8 hours to help with pain and swelling. Symptoms should improve with antibiotics.   It is important to follow up with an orthopedic if your range of motion, pain or swelling does not improve for further evaluation.

## 2023-04-10 NOTE — ED Provider Notes (Signed)
 [facility]    CSN: [id number] Arrival date & time: 04/10/23  9040      History   Chief Complaint Chief Complaint  Patient presents with   Hand Swelling   Hand Pain    HPI Ronald Walker is a 43 y.o. male.   Patient presents to clinic for left hand pain and swelling and left ring finger pain and swelling.  He noticed it the other night when he was watching TV that his hand was tingling and painful.  He noticed that his ring finger was beginning to swell so he did take off his wedding band. No recent hangnails. No fevers.   Pain with moving ring finger and along 4th finger metacarpal.   Has been alternating between Tylenol  and ibuprofen  without any improvement in pain or swelling.  Does not pick at his nails, no IV drug use and no recent injuries. Denies hx of HTN, HLD or gout.     The history is provided by the patient and medical records.  Hand Pain    Past Medical History:  Diagnosis Date   Asthma     Patient Active Problem List   Diagnosis Date Noted   Arthritis 07/26/2022    Past Surgical History:  Procedure Laterality Date   ABDOMINAL SURGERY         Home Medications    Prior to Admission medications   Medication Sig Start Date End Date Taking? Authorizing Provider  ibuprofen  (ADVIL ) 800 MG tablet Take 1 tablet (800 mg total) by mouth 3 (three) times daily. 04/10/23  Yes Modelle Vollmer  N, FNP  sulfamethoxazole -trimethoprim  (BACTRIM  DS) 800-160 MG tablet Take 1 tablet by mouth 2 (two) times daily for 7 days. 04/10/23 04/17/23 Yes Acasia Skilton  N, FNP  acetaminophen  (TYLENOL ) 500 MG tablet Take 1,000 mg by mouth every 6 (six) hours as needed for moderate pain.    [provider]  albuterol  (VENTOLIN  HFA) 108 (90 Base) MCG/ACT inhaler Inhale 1-2 puffs into the lungs every 6 (six) hours as needed for wheezing or shortness of breath. 01/03/23   Enedelia Dorna HERO, FNP    Family History History reviewed. No pertinent family  history.  Social History Social History   Tobacco Use   Smoking status: Every Day    Current packs/day: 0.50    Types: Cigarettes   Smokeless tobacco: Never  Vaping Use   Vaping status: Never Used  Substance Use Topics   Alcohol use: Yes    Alcohol/week: 7.0 standard drinks of alcohol    Types: 7 Shots of liquor per week   Drug use: Yes    Types: Marijuana     Allergies   Ampicillin and Penicillins   Review of Systems Review of Systems  Per HPI   Physical Exam Triage Vital Signs ED Triage Vitals  Encounter Vitals Group     BP 04/10/23 1055 (!) 154/80     Systolic BP Percentile --      Diastolic BP Percentile --      Pulse Rate 04/10/23 1055 82     Resp 04/10/23 1055 18     Temp 04/10/23 1055 97.7 F (36.5 C)     Temp Source 04/10/23 1055 Oral     SpO2 04/10/23 1055 95 %     Weight --      Height --      Head Circumference --      Peak Flow --      Pain Score 04/10/23 1053 10  Pain Loc --      Pain Education --      Exclude from Growth Chart --    No data found.  Updated Vital Signs BP (!) 154/80 (BP Location: Right Leg)   Pulse 82   Temp 97.7 F (36.5 C) (Oral)   Resp 18   SpO2 95%   Visual Acuity Right Eye Distance:   Left Eye Distance:   Bilateral Distance:    Right Eye Near:   Left Eye Near:    Bilateral Near:     Physical Exam Vitals and nursing note reviewed.  Constitutional:      Appearance: Normal appearance.  HENT:     Head: Normocephalic and atraumatic.     Right Ear: External ear normal.     Left Ear: External ear normal.     Nose: Nose normal.     Mouth/Throat:     Mouth: Mucous membranes are moist.  Eyes:     Conjunctiva/sclera: Conjunctivae normal.  Cardiovascular:     Rate and Rhythm: Normal rate.     Pulses: Normal pulses.  Pulmonary:     Effort: Pulmonary effort is normal. No respiratory distress.  Musculoskeletal:        General: Swelling and tenderness present. No deformity or signs of injury.     Left  hand: Tenderness present. Decreased range of motion. Normal pulse.     Comments: Decreased ROM of left ring finger with swelling of ring finger and left hand. TTP over ring finger and 4th metacarpal.   Skin:    General: Skin is warm and dry.     Capillary Refill: Capillary refill takes less than 2 seconds.  Neurological:     General: No focal deficit present.     Mental Status: He is alert and oriented to person, place, and time.  Psychiatric:        Mood and Affect: Mood normal.        Behavior: Behavior normal.      UC Treatments / Results  Labs (all labs ordered are listed, but only abnormal results are displayed) Labs Reviewed - No data to display  EKG   Radiology DG Hand Complete Left Result Date: 04/10/2023 CLINICAL DATA:  Left hand pain. Swelling worse on fourth finger. Pain at tips of fingers to wrist. EXAM: LEFT HAND - COMPLETE 3+ VIEW COMPARISON:  None Available. FINDINGS: Normal bone mineralization. Mild flexion positioning of the second through fifth interphalangeal joints causes some bone overlap. Within this limitation, joint spaces appear preserved. No acute fracture or dislocation. Mild soft tissue swelling, likely greatest within the fourth finger as the patient reports. No cortical erosion. IMPRESSION: Mild soft tissue swelling, likely greatest within the fourth finger as the patient reports. No acute fracture. Electronically Signed   By: Tanda Lyons M.D.   On: 04/10/2023 11:54    Procedures Procedures (including critical care time)  Medications Ordered in UC Medications  acetaminophen  (TYLENOL ) tablet 975 mg (975 mg Oral Given 04/10/23 1138)    Initial Impression / Assessment and Plan / UC Course  I have reviewed the triage vital signs and the nursing notes.  Pertinent labs & imaging results that were available during my care of the patient were reviewed by me and considered in my medical decision making (see chart for details).  Vitals and triage reviewed,  patient is hemodynamically stable.  Swelling of left hand and left fourth finger with changes in range of motion, suspicious for cellulitis with warmth and swelling.  Imaging does not show any acute abnormalities.  No perionychia or drainable process.  Will place on Bactrim  and encouraged hand follow-up if range of motion does not improve.  Pain management discussed.  Plan of care, follow-up care return precautions given, no questions at this time.    Final Clinical Impressions(s) / UC Diagnoses   Final diagnoses:  Left hand pain     Discharge Instructions      Your imaging did not show any bony abnormalities. Take all antibiotics as prescribed, can take with food to help with stomach upset. Keep your hand elevated to help with swelling and take ibuprofen  every 8 hours to help with pain and swelling. Symptoms should improve with antibiotics.   It is important to follow up with an orthopedic if your range of motion, pain or swelling does not improve for further evaluation.       ED Prescriptions     Medication Sig Dispense Auth. Provider   sulfamethoxazole -trimethoprim  (BACTRIM  DS) 800-160 MG tablet Take 1 tablet by mouth 2 (two) times daily for 7 days. 14 tablet Dreama, Kenshawn Maciolek  N, FNP   ibuprofen  (ADVIL ) 800 MG tablet Take 1 tablet (800 mg total) by mouth 3 (three) times daily. 21 tablet Dreama, Jarel Cuadra  N, FNP      PDMP not reviewed this encounter.   Dreama, Elysia Grand  N, FNP 04/10/23 1209

## 2023-05-16 ENCOUNTER — Other Ambulatory Visit: Payer: Self-pay

## 2023-05-16 ENCOUNTER — Encounter (HOSPITAL_COMMUNITY): Payer: Self-pay | Admitting: Emergency Medicine

## 2023-05-16 ENCOUNTER — Emergency Department (HOSPITAL_COMMUNITY)
Admission: EM | Admit: 2023-05-16 | Discharge: 2023-05-16 | Disposition: A | Discharge status: Home / Self Care | Payer: Self-pay | Attending: Emergency Medicine | Admitting: Emergency Medicine

## 2023-05-16 DIAGNOSIS — L0291 Cutaneous abscess, unspecified: Secondary | ICD-10-CM

## 2023-05-16 DIAGNOSIS — Z79899 Other long term (current) drug therapy: Secondary | ICD-10-CM | POA: Diagnosis not present

## 2023-05-16 DIAGNOSIS — L0231 Cutaneous abscess of buttock: Secondary | ICD-10-CM | POA: Insufficient documentation

## 2023-05-16 DIAGNOSIS — B356 Tinea cruris: Secondary | ICD-10-CM | POA: Diagnosis not present

## 2023-05-16 MED ORDER — DOXYCYCLINE HYCLATE 100 MG PO CAPS: 100.0000 mg | ORAL_CAPSULE | Freq: Two times a day (BID) | ORAL | 0 refills | Status: AC

## 2023-05-16 MED ORDER — LIDOCAINE HCL (PF) 1 % IJ SOLN
30.0000 mL | Freq: Once | INTRAMUSCULAR | Status: AC
  Administered 2023-05-16: 30 mL
  Filled 2023-05-16: qty 30

## 2023-05-16 MED ORDER — CLOTRIMAZOLE-BETAMETHASONE 1-0.05 % EX CREA: TOPICAL_CREAM | CUTANEOUS | 1 refills | Status: AC

## 2023-05-16 MED ORDER — DOXYCYCLINE HYCLATE 100 MG PO TABS
100.0000 mg | ORAL_TABLET | Freq: Once | ORAL | Status: AC
Start: 2023-05-16 — End: 2023-05-16
  Administered 2023-05-16: 100 mg via ORAL
  Filled 2023-05-16: qty 1

## 2023-05-16 NOTE — ED Triage Notes (Signed)
 Pt c/o 2 abscesses, one on the inside of his right thigh and one under his left buttock x 3 days.

## 2023-05-16 NOTE — ED Provider Notes (Signed)
 [facility] Provider Note   CSN: [id number] Arrival date & time: 05/16/23  0507     History  Chief Complaint  Patient presents with   Abscess    Ronald Walker is a 43 y.o. male.  Patient presents with concerns over a rash in his groin and 2 abscesses on his buttocks.  Symptoms present for 3 days.  The rash started first, he has been using hydrocortisone but it seems to be getting worse.  He has a swollen area on each buttock that is painful, tender but has not been draining.       Home Medications Prior to Admission medications   Medication Sig Start Date End Date Taking? Authorizing Provider  clotrimazole-betamethasone (LOTRISONE) cream Apply to rash on groin and buttocks 2 times daily prn 05/16/23  Yes Aliesha Dolata, Canary Brim, MD  doxycycline (VIBRAMYCIN) 100 MG capsule Take 1 capsule (100 mg total) by mouth 2 (two) times daily. 05/16/23  Yes Reford Olliff, Canary Brim, MD  acetaminophen (TYLENOL) 500 MG tablet Take 1,000 mg by mouth every 6 (six) hours as needed for moderate pain.    [provider]  albuterol (VENTOLIN HFA) 108 (90 Base) MCG/ACT inhaler Inhale 1-2 puffs into the lungs every 6 (six) hours as needed for wheezing or shortness of breath. 01/03/23   Carlisle Beers, FNP  ibuprofen (ADVIL) 800 MG tablet Take 1 tablet (800 mg total) by mouth 3 (three) times daily. 04/10/23   Garrison, [place] N, FNP      Allergies    Ampicillin and Penicillins    Review of Systems   Review of Systems  Physical Exam Updated Vital Signs BP 137/80 (BP Location: Right Arm)   Pulse 91   Temp 98.4 F (36.9 C)   Resp 18   Ht 5\' 9"  (1.753 m)   Wt 95.3 kg   SpO2 100%   BMI 31.03 kg/m  Physical Exam Vitals and nursing note reviewed.  Constitutional:      Appearance: Normal appearance.  HENT:     Head: Normocephalic.  Pulmonary:     Effort: Pulmonary effort is normal.  Musculoskeletal:        General: Normal range of  motion.  Skin:    Findings: Abscess (3 cm area of tenderness, induration left lower buttock; 3 cm area of induration, tenderness right posterior upper thigh/buttock) and rash (Erythema and slight whitish exudate in skin folds of groin and between buttocks) present.  Neurological:     Mental Status: He is alert.     ED Results / Procedures / Treatments   Labs (all labs ordered are listed, but only abnormal results are displayed) Labs Reviewed - No data to display  EKG None  Radiology No results found.  Procedures .Incision and Drainage  Date/Time: 05/16/2023 6:28 AM  Performed by: Gilda Crease, MD Authorized by: Gilda Crease, MD   Consent:    Consent obtained:  Verbal   Consent given by:  Patient   Risks, benefits, and alternatives were discussed: yes     Risks discussed:  Bleeding, incomplete drainage and pain Universal protocol:    Procedure explained and questions answered to patient or proxy's satisfaction: yes     Site/side marked: yes     Immediately prior to procedure, a time out was called: yes     Patient identity confirmed:  Verbally with patient Location:    Type:  Abscess   Size:  2   Location:  Anogenital   Anogenital location: R buttock. Pre-procedure details:    Skin preparation:  Povidone-iodine Sedation:    Sedation type:  None Anesthesia:    Anesthesia method:  Local infiltration   Local anesthetic:  Lidocaine 1% w/o epi Procedure type:    Complexity:  Simple Procedure details:    Ultrasound guidance: no     Needle aspiration: no     Incision types:  Single straight   Incision depth:  Dermal   Wound management:  Probed and deloculated and irrigated with saline   Drainage:  Purulent   Drainage amount:  Scant   Wound treatment:  Wound left open Post-procedure details:    Procedure completion:  Tolerated well, no immediate complications .Incision and Drainage  Date/Time: 05/16/2023 6:29 AM  Performed by: Gilda Crease, MD Authorized by: Gilda Crease, MD   Consent:    Consent obtained:  Verbal   Consent given by:  Patient   Risks, benefits, and alternatives were discussed: yes     Risks discussed:  Bleeding, incomplete drainage and pain Universal protocol:    Procedure explained and questions answered to patient or proxy's satisfaction: yes     Site/side marked: yes     Immediately prior to procedure, a time out was called: yes     Patient identity confirmed:  Verbally with patient Location:    Type:  Abscess   Size:  2   Location:  Anogenital   Anogenital location: L buttock. Pre-procedure details:    Skin preparation:  Povidone-iodine Sedation:    Sedation type:  None Anesthesia:    Anesthesia method:  Local infiltration   Local anesthetic:  Lidocaine 1% w/o epi Procedure type:    Complexity:  Simple Procedure details:    Ultrasound guidance: no     Needle aspiration: no     Incision types:  Single straight   Incision depth:  Dermal   Wound management:  Probed and deloculated and irrigated with saline   Drainage:  Purulent   Drainage amount:  Scant   Wound treatment:  Wound left open Post-procedure details:    Procedure completion:  Tolerated well, no immediate complications     Medications Ordered in ED Medications  lidocaine (PF) (XYLOCAINE) 1 % injection 30 mL (30 mLs Infiltration Given by Other 05/16/23 8413)  doxycycline (VIBRA-TABS) tablet 100 mg (100 mg Oral Given 05/16/23 0630)    ED Course/ Medical Decision Making/ A&P                                 Medical Decision Making Risk Prescription drug management.   Patient with rash in the inguinal area and gluteal fold that is consistent with fungal infection.  This will be treated with Lotrisone.  This likely caused the irritation to the area that led to an abscess of both buttocks.  An I&D was performed on each of these areas and he will be started empirically on doxycycline.  Given wound care  precautions and return precautions.        Final Clinical Impression(s) / ED Diagnoses Final diagnoses:  Abscess  Tinea cruris    Rx / DC Orders ED Discharge Orders          Ordered    doxycycline (VIBRAMYCIN) 100 MG capsule  2 times daily        05/16/23 0625    clotrimazole-betamethasone (LOTRISONE) cream  05/16/23 0981              Gilda Crease, MD 05/16/23 [phone]

## 2023-11-15 ENCOUNTER — Emergency Department (HOSPITAL_COMMUNITY)
Admission: EM | Admit: 2023-11-15 | Discharge: 2023-11-15 | Disposition: A | Discharge status: Home / Self Care | Payer: Self-pay | Attending: Student | Admitting: Student

## 2023-11-15 ENCOUNTER — Encounter (HOSPITAL_COMMUNITY): Payer: Self-pay

## 2023-11-15 DIAGNOSIS — H60503 Unspecified acute noninfective otitis externa, bilateral: Secondary | ICD-10-CM

## 2023-11-15 DIAGNOSIS — H60513 Acute actinic otitis externa, bilateral: Secondary | ICD-10-CM | POA: Insufficient documentation

## 2023-11-15 MED ORDER — OFLOXACIN 0.3 % OT SOLN: 5.0000 [drp] | Freq: Two times a day (BID) | OTIC | 0 refills | Status: AC

## 2023-11-15 NOTE — ED Provider Notes (Signed)
 [facility] AT [facility] Provider Note   CSN: [id number] Arrival date & time: 11/15/23  2224     Patient presents with: Ear Pain   Ronald Walker is a 43 y.o. male with 3 days of progressively worsening left > right bilateral ear pain does not radiate, no fevers chills or other URI symptoms.  Patient does use Q-tips and states I think it started from scratch.  No known ill contacts.  No medical diagnoses.   HPI     Prior to Admission medications   Medication Sig Start Date End Date Taking? Authorizing Provider  ofloxacin  (FLOXIN ) 0.3 % OTIC solution Place 5 drops into both ears 2 (two) times daily for 7 days. 11/15/23 11/22/23 Yes Ruairi Stutsman R, PA-C  acetaminophen  (TYLENOL ) 500 MG tablet Take 1,000 mg by mouth every 6 (six) hours as needed for moderate pain.    [provider]  albuterol  (VENTOLIN  HFA) 108 (90 Base) MCG/ACT inhaler Inhale 1-2 puffs into the lungs every 6 (six) hours as needed for wheezing or shortness of breath. 01/03/23   Enedelia Dorna HERO, FNP  clotrimazole -betamethasone  (LOTRISONE ) cream Apply to rash on groin and buttocks 2 times daily prn 05/16/23   Pollina, Lonni PARAS, MD  doxycycline  (VIBRAMYCIN ) 100 MG capsule Take 1 capsule (100 mg total) by mouth 2 (two) times daily. 05/16/23   Haze Lonni PARAS, MD  ibuprofen  (ADVIL ) 800 MG tablet Take 1 tablet (800 mg total) by mouth 3 (three) times daily. 04/10/23   Dreama, [place]  N, FNP    Allergies: Ampicillin and Penicillins    Review of Systems  HENT:  Positive for ear discharge and ear pain.     Updated Vital Signs BP (!) 145/75   Pulse 80   Temp 98 F (36.7 C)   Resp 14   SpO2 99%   Physical Exam Vitals and nursing note reviewed.  Constitutional:      Appearance: He is not ill-appearing or toxic-appearing.  HENT:     Head: Normocephalic and atraumatic.     Right Ear: Tympanic membrane and external ear normal. No mastoid tenderness.      Left Ear: External ear normal. Drainage present. No mastoid tenderness.     Ears:     Comments: Patient with clear watery discharge from the left ear with extensive canal edema and nonvisualization of the left TM.  Right canal with erythema and mild swelling, right TM unremarkable.    Nose: Nose normal.     Mouth/Throat:     Pharynx: Oropharynx is clear. Uvula midline.     Tonsils: No tonsillar exudate or tonsillar abscesses.  Eyes:     General: No scleral icterus.       Right eye: No discharge.        Left eye: No discharge.     Conjunctiva/sclera: Conjunctivae normal.  Neck:     Trachea: Trachea and phonation normal.  Pulmonary:     Effort: Pulmonary effort is normal.  Musculoskeletal:     Cervical back: Normal range of motion.  Lymphadenopathy:     Cervical: No cervical adenopathy.  Skin:    General: Skin is warm and dry.  Neurological:     General: No focal deficit present.     Mental Status: He is alert.  Psychiatric:        Mood and Affect: Mood normal.     (all labs ordered are listed, but only abnormal results are displayed) Labs Reviewed - No data to  display  EKG: None  Radiology: No results found.   Procedures   Medications Ordered in the ED - No data to display                                  Medical Decision Making  43 y/o male with bilateral ear pain.   VS reassuring on intake. BL ear exam as above. HEENT exam otherwise unremarkable.   DDX includes but is not limited to otitis externa, otitis media, mastoiditis, referred pain from sore throat.    Risk Prescription drug management.  Clinical picture most consistent with bilateral otitis externa. Will d/c with script for oflox drops. Recommend close outpatient follow up. Clinical concern for emergent underlying etiology of the patient's symptoms that would warrant further ED workup or inpatient management is exceedingly low.   Ronald Walker voiced understanding of his medical evaluation and treatment  plan. Each of their questions answered to their expressed satisfaction.  Return precautions were given.  Patient is well-appearing, stable, and was discharged in good condition.  This chart was dictated using voice recognition software, Dragon. Despite the best efforts of this provider to proofread and correct errors, errors may still occur which can change documentation meaning.      Final diagnoses:  Acute otitis externa of both ears, unspecified type    ED Discharge Orders          Ordered    ofloxacin  (FLOXIN ) 0.3 % OTIC solution  2 times daily        11/15/23 2325               Anjuli Gemmill, Pleasant SAUNDERS, PA-C 11/15/23 2332    Carita Senior, MD 11/16/23 [phone]

## 2023-11-15 NOTE — Discharge Instructions (Signed)
 You are seen in the ER today for your ear pain.  You had skin infection inside both of your ears likely related to your Q-tip use.  Please use the prescribed antibiotic eardrops twice a day for the next week in both ears.  Follow-up with your primary care doctor and return to the ER with any severe symptoms.

## 2023-11-15 NOTE — ED Triage Notes (Signed)
 Pt reports that L ear has been bothering him for the past 2-3 days, denies fevers
